# Patient Record
Sex: Male | Born: 1960
Health system: Southern US, Community
[De-identification: ages and names within clinical notes are randomized; demographics above are authoritative.]

## PROBLEM LIST (undated history)

## (undated) DIAGNOSIS — J189 Pneumonia, unspecified organism: Secondary | ICD-10-CM

## (undated) DIAGNOSIS — K635 Polyp of colon: Secondary | ICD-10-CM

## (undated) DIAGNOSIS — R112 Nausea with vomiting, unspecified: Secondary | ICD-10-CM

## (undated) DIAGNOSIS — Z460 Encounter for fitting and adjustment of spectacles and contact lenses: Secondary | ICD-10-CM

## (undated) DIAGNOSIS — M1990 Unspecified osteoarthritis, unspecified site: Secondary | ICD-10-CM

## (undated) DIAGNOSIS — Z9889 Other specified postprocedural states: Secondary | ICD-10-CM

## (undated) HISTORY — DX: Unspecified osteoarthritis, unspecified site: M19.90

## (undated) HISTORY — PX: MELANOMA EXCISION: SHX5266

## (undated) HISTORY — DX: Polyp of colon: K63.5

---

## 1999-09-12 HISTORY — PX: BACK SURGERY: SHX140

## 1999-12-26 ENCOUNTER — Ambulatory Visit (HOSPITAL_COMMUNITY): Admission: RE | Admit: 1999-12-26 | Discharge: 1999-12-26 | Payer: Self-pay | Admitting: Gastroenterology

## 2002-07-02 ENCOUNTER — Encounter: Admission: RE | Admit: 2002-07-02 | Discharge: 2002-07-02 | Payer: Self-pay | Admitting: Neurosurgery

## 2002-07-02 ENCOUNTER — Encounter: Payer: Self-pay | Admitting: Neurosurgery

## 2002-07-15 ENCOUNTER — Encounter: Payer: Self-pay | Admitting: Neurosurgery

## 2002-07-15 ENCOUNTER — Encounter: Admission: RE | Admit: 2002-07-15 | Discharge: 2002-07-15 | Payer: Self-pay | Admitting: Neurosurgery

## 2002-10-01 ENCOUNTER — Encounter: Payer: Self-pay | Admitting: Orthopaedic Surgery

## 2002-10-01 ENCOUNTER — Encounter: Admission: RE | Admit: 2002-10-01 | Discharge: 2002-10-01 | Payer: Self-pay | Admitting: Orthopaedic Surgery

## 2002-12-12 ENCOUNTER — Encounter: Admission: RE | Admit: 2002-12-12 | Discharge: 2002-12-12 | Payer: Self-pay | Admitting: Orthopaedic Surgery

## 2002-12-12 ENCOUNTER — Encounter: Payer: Self-pay | Admitting: Orthopaedic Surgery

## 2003-03-04 ENCOUNTER — Encounter: Payer: Self-pay | Admitting: Orthopaedic Surgery

## 2003-03-05 ENCOUNTER — Encounter: Payer: Self-pay | Admitting: Orthopaedic Surgery

## 2003-03-05 ENCOUNTER — Ambulatory Visit (HOSPITAL_COMMUNITY): Admission: RE | Admit: 2003-03-05 | Discharge: 2003-03-06 | Payer: Self-pay | Admitting: Orthopaedic Surgery

## 2003-03-05 ENCOUNTER — Encounter (INDEPENDENT_AMBULATORY_CARE_PROVIDER_SITE_OTHER): Payer: Self-pay | Admitting: *Deleted

## 2007-09-12 DIAGNOSIS — K635 Polyp of colon: Secondary | ICD-10-CM

## 2007-09-12 HISTORY — PX: COLONOSCOPY W/ POLYPECTOMY: SHX1380

## 2007-09-12 HISTORY — DX: Polyp of colon: K63.5

## 2009-01-19 ENCOUNTER — Ambulatory Visit: Payer: Self-pay | Admitting: Sports Medicine

## 2009-01-19 ENCOUNTER — Encounter: Admission: RE | Admit: 2009-01-19 | Discharge: 2009-01-19 | Payer: Self-pay | Admitting: Sports Medicine

## 2009-01-19 DIAGNOSIS — M2020 Hallux rigidus, unspecified foot: Secondary | ICD-10-CM | POA: Insufficient documentation

## 2009-01-19 DIAGNOSIS — R109 Unspecified abdominal pain: Secondary | ICD-10-CM | POA: Insufficient documentation

## 2009-03-16 ENCOUNTER — Ambulatory Visit: Payer: Self-pay | Admitting: Sports Medicine

## 2009-03-16 DIAGNOSIS — M79609 Pain in unspecified limb: Secondary | ICD-10-CM | POA: Insufficient documentation

## 2011-01-27 NOTE — Op Note (Signed)
NAME:  Mark Atkins, Mark Atkins                            ACCOUNT NO.:  [id number]   MEDICAL RECORD NO.:  [id number]                   PATIENT TYPE:  OIB   LOCATION:  NA                                   FACILITY:  [facility]   PHYSICIAN:  Sharolyn Douglas, M.D.                     DATE OF BIRTH:  04/25/1961   DATE OF PROCEDURE:  03/05/2003  DATE OF DISCHARGE:                                 OPERATIVE REPORT   PREOPERATIVE DIAGNOSIS:  L4 herniated nucleus pulposus with left L5  radiculopathy.   POSTOPERATIVE DIAGNOSIS:  L4 herniated nucleus pulposus with left L5  radiculopathy.   PROCEDURE:  1. Percutaneous lumbar discectomy L4-L5.  2. Radiographic interpretation of fluoroscopic images used for needle     localization.   INDICATIONS FOR PROCEDURE:  The patient is a 50 year old male with a greater  than one year history of back and left lower extremity radicular pain.  He  has seen several physicians.  He had an MRI scan which showed degenerative  disc disease with a focal HNP at L4-L5 with left L5 nerve root compression.  He has tried three epidural steroid injections and physical therapy.  We  have had several lengthy discussions regarding various treatment options.  He has elected to undergo percutaneous discectomies to have some improvement  in his symptoms.  The risks and benefits were thoroughly explained.   PROCEDURE:  The patient was properly identified in the holding area, was  taken to the operating room.  He was carefully positioned prone on the  [place] table with a pillow under his abdomen to flex his lumbar spine.  He  was sedated by the anesthesia service.  He was given prophylactic IV  antibiotics.  The back was prepped and draped in the usual sterile fashion.  Fluoroscopy was brought into the field and we obtained a 45 degree oblique  view of the L4-L5 level.  We then used a spinal needle to inject 1 mL of  lidocaine with epinephrine along the planned needle trajectory.  The  introducer needle was then placed into the L4-L5 disc space using a  posterolateral approach.  The patient was questioned if he had developed any  leg pain.  At no point did he have any lower extremity pain.  The needle was  then confirmed to be in good position, AP and lateral views.  We then  injected 1 mL of Ancef solution into the disc space to irrigate.  The  patient did feel back pain on injection of the antibiotic solution.  We then  placed the Stryker decompressor probe in through the introducer needle and  made multiple passes over three minutes under fluoroscopic visualization.  We removed 2.5 mL of disc material which was sent to the pathology lab.  The  Stryker decompressor probe was then removed.  1 mL of Ancef solution was  then injected through the introducer needle.  The introducer needle was  removed and a Band-Aid was placed over the patient's puncture site.  He was  turned supine and transferred to the recovery room in stable condition, able  to move his upper and lower extremities.                                               Sharolyn Douglas, M.D.    MC/MEDQ  D:  03/05/2003  T:  03/06/2003  Job:  [id number]

## 2011-05-04 ENCOUNTER — Ambulatory Visit (INDEPENDENT_AMBULATORY_CARE_PROVIDER_SITE_OTHER): Payer: BC Managed Care – PPO | Admitting: General Surgery

## 2011-05-04 ENCOUNTER — Encounter (INDEPENDENT_AMBULATORY_CARE_PROVIDER_SITE_OTHER): Payer: Self-pay | Admitting: General Surgery

## 2011-05-04 DIAGNOSIS — K4090 Unilateral inguinal hernia, without obstruction or gangrene, not specified as recurrent: Secondary | ICD-10-CM | POA: Insufficient documentation

## 2011-05-04 NOTE — Patient Instructions (Signed)
Plan for right inguinal hernia repair with mesh 

## 2011-05-04 NOTE — Progress Notes (Signed)
Subjective:     Patient ID: Mark Atkins, male   DOB: 02/08/1961, 50 y.o.   MRN: [id number]  HPI The patient is a 50 year old white male who is been having some dull right-sided groin pain for the last 5 weeks. He is very active with biking and swimming. He does not remember any particular event that started the pain. Shortly after noticing the pain he started to see a bulge in the right groin. He denies any nausea or vomiting. No fevers or chills. No chest pain or shortness of breath. His bowels move regularly.  Review of Systems  Constitutional: Negative.   HENT: Negative.   Eyes: Negative.   Respiratory: Negative.   Cardiovascular: Negative.   Gastrointestinal: Negative.   Genitourinary: Negative.   Musculoskeletal: Negative.   Skin: Negative.   Neurological: Negative.   Hematological: Negative.   Psychiatric/Behavioral: Negative.    Past Medical History  Diagnosis Date  . Asthma   . Colon polyp 2009  . Arthritis     left knee   Past Surgical History  Procedure Date  . Colonoscopy w/ polypectomy 2009  . Melanoma excision     2009   Current outpatient prescriptions:Albuterol Sulfate (PROVENTIL HFA IN), Inhale into the lungs as needed.  , Disp: , Rfl: ;  aspirin 81 MG tablet, Take 81 mg by mouth daily.  , Disp: , Rfl: ;  clonazePAM (KLONOPIN) 0.5 MG tablet, daily., Disp: , Rfl: ;  Multiple Vitamin (MULTIVITAMIN) capsule, Take 1 capsule by mouth daily.  , Disp: , Rfl:   No Known Allergies      Objective:   Physical Exam  Constitutional: He is oriented to person, place, and time. He appears well-developed and well-nourished.  HENT:  Head: Normocephalic and atraumatic.  Eyes: Conjunctivae and EOM are normal. Pupils are equal, round, and reactive to light.  Neck: Normal range of motion. Neck supple.  Cardiovascular: Normal rate, regular rhythm and normal heart sounds.   Pulmonary/Chest: Effort normal and breath sounds normal.  Abdominal: Soft. Bowel sounds are normal.    Genitourinary: Penis normal.       The patient has a small bulge in the right groin that is easily reducible. No bulge or impulse with straining in the left groin.  Musculoskeletal: Normal range of motion.  Neurological: He is alert and oriented to person, place, and time.  Skin: Skin is warm and dry.  Psychiatric: He has a normal mood and affect. His behavior is normal.       Assessment:     Small but symptomatic right inguinal hernia.    Plan:     Because of the risk of incarceration and strangulation I think he would benefit from having this fixed. He would also like to have this done. I've discussed with him in detail the risks and benefits of the operation to fix the hernia as well as some technical aspects and he understands and wishes to proceed.

## 2011-06-07 ENCOUNTER — Other Ambulatory Visit (INDEPENDENT_AMBULATORY_CARE_PROVIDER_SITE_OTHER): Payer: Self-pay | Admitting: General Surgery

## 2011-06-07 ENCOUNTER — Encounter (HOSPITAL_COMMUNITY): Payer: BC Managed Care – PPO

## 2011-06-07 LAB — BASIC METABOLIC PANEL
BUN: 20 mg/dL (ref 6–23)
CO2: 30 mEq/L (ref 19–32)
Calcium: 9.4 mg/dL (ref 8.4–10.5)
Chloride: 104 mEq/L (ref 96–112)
Creatinine, Ser: 1.06 mg/dL (ref 0.50–1.35)
Glucose, Bld: 121 mg/dL — ABNORMAL HIGH (ref 70–99)

## 2011-06-07 LAB — CBC
HCT: 42.4 % (ref 39.0–52.0)
Hemoglobin: 14.5 g/dL (ref 13.0–17.0)
MCH: 31.8 pg (ref 26.0–34.0)
MCV: 93 fL (ref 78.0–100.0)
Platelets: 279 10*3/uL (ref 150–400)
RBC: 4.56 MIL/uL (ref 4.22–5.81)
WBC: 5.5 10*3/uL (ref 4.0–10.5)

## 2011-06-07 LAB — DIFFERENTIAL
Eosinophils Absolute: 0.1 10*3/uL (ref 0.0–0.7)
Lymphocytes Relative: 29 % (ref 12–46)
Lymphs Abs: 1.6 10*3/uL (ref 0.7–4.0)
Monocytes Relative: 10 % (ref 3–12)
Neutrophils Relative %: 60 % (ref 43–77)

## 2011-06-07 LAB — SURGICAL PCR SCREEN: MRSA, PCR: NEGATIVE

## 2011-06-12 HISTORY — PX: HERNIA REPAIR: SHX51

## 2011-06-14 ENCOUNTER — Observation Stay (HOSPITAL_COMMUNITY)
Admission: RE | Admit: 2011-06-14 | Discharge: 2011-06-15 | Disposition: A | Discharge status: Home / Self Care | Payer: BC Managed Care – PPO | Source: Ambulatory Visit | Attending: General Surgery | Admitting: General Surgery

## 2011-06-14 DIAGNOSIS — Z01812 Encounter for preprocedural laboratory examination: Secondary | ICD-10-CM | POA: Insufficient documentation

## 2011-06-14 DIAGNOSIS — K4090 Unilateral inguinal hernia, without obstruction or gangrene, not specified as recurrent: Principal | ICD-10-CM | POA: Insufficient documentation

## 2011-06-30 NOTE — Op Note (Signed)
NAME:  Mark Atkins, Mark Atkins NO.:  [id number]  MEDICAL RECORD NO.:  [id number]  LOCATION:  DAYL                         FACILITY:  [facility]  PHYSICIAN:  Ollen Gross. Vernell Morgans, M.D. DATE OF BIRTH:  09/05/1961  DATE OF PROCEDURE:  06/14/2011 DATE OF DISCHARGE:                              OPERATIVE REPORT   PREOPERATIVE DIAGNOSIS:  Right inguinal hernia.  POSTOPERATIVE DIAGNOSIS:  Right indirect inguinal hernia.  PROCEDURE:  Right inguinal hernia repair with mesh.  SURGEON:  Ollen Gross. Vernell Morgans, M.D.  ANESTHESIA:  General endotracheal.  PROCEDURE IN DETAIL:  After informed consent was obtained, the patient was brought to the operating room, placed in the supine position on the operating table.  After adequate induction of general anesthesia, the patient's abdomen and right groin were prepped with ChloraPrep, allowed to dry and draped in usual sterile manner.  The right groin area was then infiltrated with 0.25% Marcaine.  A small incision was made from the edge of the pubic tubercle on the right towards the anterior cephalic spine.  This incision was carried down through the skin and subcutaneous tissue sharply with the electrocautery until the fascia of the external oblique was encountered.  A small bridging vein was clamped with hemostats, divided and ligated with 3-0 silk ties.  The external oblique fascia was opened along its fibers towards the apex of the external ring with a 15 blade knife and Metzenbaum scissors.  Weitlaner retractor was deployed.  Blunt dissection was carried out of the cord structures until they could be surrounded between two fingers.  1/2-inch Penrose drain was placed around the cord structures for retraction purposes.  The ilioinguinal nerve was identified.  It was involved and some scar was dissected free, proximally and distally clamped with hemostats, divided and ligated with 3-0 silk ties.  The cord structure was then gently skeletonized by  blunt hemostat dissection until a hernia sac was able to be identified.  It was gently separated from the rest of the cord structures.  The hernia sac was thick and very small.  It was therefore reduced back beneath the transversalis muscle without difficulty.  Next, a 3 x 6 piece of Ultrapro mesh was chosen and cut to fit.  The patient also had a moderate sized lipoma of the cord which was also separated from the rest of the cord structures gently and then clamped at its base, divided and ligated with a 3-0 silk tie.  The mesh tails were cut with the mesh laterally.  The mesh was sewed inferiorly to the shelving edge of the inguinal ligament with a running 2-0 Prolene stitch.  The tails were wrapped around the cord superiorly.  The mesh was sewed to the muscular aponeurotic strength of the transversalis with interrupted 2-0 Prolene vertical mattress stitches.  Lateral to the cord, the mesh was anchored to the shelving edge of inguinal ligament with interrupted 2-0 Prolene stitch.  Once this was accomplished, the hernia appeared to be well repaired.  Mesh was in good position without any tension.  The wound was irrigated with copious amounts of saline. The external oblique fascia was then reapproximated with a running 2-0  Vicryl stitch.  The wound was infiltrated more with Exparel.  The subcutaneous fascia was closed with a running 3-0 Vicryl stitch and the skin was closed with a running 4-0 Monocryl subcuticular stitch. Dermabond dressing was then applied.  The patient tolerated the procedure well.  At the end of the case, all needle, sponge, and instrument counts were correct.  The patient's testicle was in the scrotum at the end of the case.     Ollen Gross. Vernell Morgans, M.D.     PST/MEDQ  D:  06/14/2011  T:  06/14/2011  Job:  [id number]  Electronically Signed by Chevis Pretty III M.D. on 06/30/2011 09:06:42 AM

## 2011-07-10 ENCOUNTER — Encounter (INDEPENDENT_AMBULATORY_CARE_PROVIDER_SITE_OTHER): Payer: Self-pay | Admitting: General Surgery

## 2011-07-10 ENCOUNTER — Ambulatory Visit (INDEPENDENT_AMBULATORY_CARE_PROVIDER_SITE_OTHER): Payer: BC Managed Care – PPO | Admitting: General Surgery

## 2011-07-10 DIAGNOSIS — K4090 Unilateral inguinal hernia, without obstruction or gangrene, not specified as recurrent: Secondary | ICD-10-CM

## 2011-07-10 NOTE — Discharge Summary (Signed)
  NAMESANFORD, LINDBLAD NO.:  [id number]  MEDICAL RECORD NO.:  [id number]  LOCATION:  1537                         FACILITY:  [facility]  PHYSICIAN:  Ollen Gross. Vernell Morgans, M.D. DATE OF BIRTH:  06-22-1961  DATE OF ADMISSION:  06/14/2011 DATE OF DISCHARGE:  06/15/2011                              DISCHARGE SUMMARY   Mr. Eline was brought to the operating room on June 14, 2011, and underwent a right inguinal repair with mesh.  She tolerated this well. Postoperatively, he was discharged home.  MEDICATIONS:  He was to resume his home meds.  He was given a prescription for pain medicine.  DIET:  As tolerated.  ACTIVITIES:  No heavy lifting.  FOLLOWUP:  He will follow up with Dr. Carolynne Edouard in 2 weeks and he is discharged home.     Ollen Gross. Vernell Morgans, M.D.     PST/MEDQ  D:  06/30/2011  T:  06/30/2011  Job:  [id number]  Electronically Signed by Chevis Pretty III M.D. on 07/10/2011 08:10:13 AM

## 2011-07-10 NOTE — Patient Instructions (Signed)
No heavy lifting for another 3 weeks 

## 2011-07-11 NOTE — Progress Notes (Signed)
Subjective:     Patient ID: Mark Atkins, male   DOB: 01/22/1961, 50 y.o.   MRN: [id number]  HPI The patient is a 50 year old white male who is now about 3 weeks out from a right inguinal hernia repair with mesh. He seems to be doing well. His soreness is improving. He seems to be moving around comfortably. His appetite is good and his bowels are working normally.  Review of Systems     Objective:   Physical Exam On exam his abdomen is soft and nontender. His right groin incision is healing nicely. There is no sign of infection. There is no palpable evidence for recurrence of the hernia.    Assessment:     3 weeks status post right inguinal hernia repair with mesh    Plan:     At this point I would like him to refrain from any strenuous activity or heavy lifting for about another 3 weeks. We will plan to see him back at the end of this time to clear him for full activity.

## 2011-11-21 ENCOUNTER — Other Ambulatory Visit: Payer: Self-pay | Admitting: Dermatology

## 2011-12-18 ENCOUNTER — Other Ambulatory Visit: Payer: Self-pay | Admitting: Plastic Surgery

## 2012-08-14 ENCOUNTER — Other Ambulatory Visit: Payer: Self-pay | Admitting: Dermatology

## 2013-01-15 ENCOUNTER — Other Ambulatory Visit: Payer: Self-pay | Admitting: Dermatology

## 2013-08-11 HISTORY — PX: GREAT TOE ARTHRODESIS, INTERPHALANGEAL JOINT: SUR55

## 2014-04-10 ENCOUNTER — Other Ambulatory Visit: Payer: Self-pay | Admitting: Orthopedic Surgery

## 2014-04-16 ENCOUNTER — Encounter (HOSPITAL_BASED_OUTPATIENT_CLINIC_OR_DEPARTMENT_OTHER): Payer: Self-pay | Admitting: *Deleted

## 2014-04-16 NOTE — Progress Notes (Signed)
No labs needed-did have physical 6/15 pcp-dr rankin

## 2014-04-21 NOTE — H&P (Signed)
Mark Atkins is an 53 y.o. male.    Chief Complaint: Right Knee Pain  HPI: Mr. Mark Atkins returns  with continued occasional catching and giving away of his right knee.  MRI scan is consistent with a complex tear posterior horn of the medial meniscus with some small para meniscal cyst.  Reviewing his MRI scan looks like he has a combination horizontal cleavage tear with a displaced parrot-beak tear.  There is also grade 3 to grade 4 chondromalacia of the apex of the patella.  Grade 2 chondromalacia to the medial femoral compartment.  Past Medical History  Diagnosis Date  . Asthma   . Colon polyp 2009  . Arthritis     left knee  . PONV (postoperative nausea and vomiting)   . Contact lens/glasses fitting     wears contacts or glasses    Past Surgical History  Procedure Laterality Date  . Colonoscopy w/ polypectomy  2009  . Melanoma excision      2009  . Hernia repair  10/12    inguinal right  . Back surgery  2001    disk  . Great toe arthrodesis, interphalangeal joint  12/14    rt    Family History  Problem Relation Age of Onset  . Hyperlipidemia Mother   . Cancer Father     colon, skin, stomach   Social History:  reports that he has never smoked. He has never used smokeless tobacco. He reports that he drinks about 4.2 ounces of alcohol per week. He reports that he does not use illicit drugs.  Allergies: No Known Allergies  No prescriptions prior to admission    No results found for this or any previous visit (from the past 48 hour(s)). No results found.  Review of Systems  Constitutional: Negative.   HENT: Negative.   Eyes: Negative.   Respiratory: Negative.   Cardiovascular: Negative.   Gastrointestinal: Negative.   Genitourinary: Negative.   Musculoskeletal: Positive for joint pain.  Skin: Negative.   Neurological: Negative.   Endo/Heme/Allergies: Negative.   Psychiatric/Behavioral: The patient is nervous/anxious.     Height 5\' 10"  (1.778 m), weight 79.379 kg  (175 lb). Physical Exam  Constitutional: He is oriented to person, place, and time. He appears well-developed and well-nourished.  HENT:  Head: Normocephalic and atraumatic.  Eyes: Pupils are equal, round, and reactive to light.  Neck: Normal range of motion. Neck supple.  Cardiovascular: Intact distal pulses.   Respiratory: Effort normal.  Musculoskeletal: He exhibits tenderness.  Tender along the medial joint line of the right knee, no palpable effusion.  McMurray's test reproduces pain.    Neurological: He is alert and oriented to person, place, and time.  Skin: Skin is warm and dry.  Psychiatric: He has a normal mood and affect. His behavior is normal. Judgment and thought content normal.     Assessment/Plan Assess: Right knee combination horizontal cleavage and displaced parrot-beak tear posterior horn medial meniscus  Plan: The patient would like to proceed with elective arthroscopic evaluation in September hopefully, the cortisone shot will see him through this period of time.  He will avoid any running or jumping activities.  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  PHILLIPS, ERIC R 04/21/2014, 9:58 AM

## 2014-04-22 ENCOUNTER — Encounter (HOSPITAL_BASED_OUTPATIENT_CLINIC_OR_DEPARTMENT_OTHER)
Admission: RE | Disposition: A | Discharge status: Home / Self Care | Payer: Self-pay | Source: Ambulatory Visit | Attending: Orthopedic Surgery

## 2014-04-22 ENCOUNTER — Ambulatory Visit (HOSPITAL_BASED_OUTPATIENT_CLINIC_OR_DEPARTMENT_OTHER): Payer: BC Managed Care – PPO | Admitting: Anesthesiology

## 2014-04-22 ENCOUNTER — Ambulatory Visit (HOSPITAL_BASED_OUTPATIENT_CLINIC_OR_DEPARTMENT_OTHER)
Admission: RE | Admit: 2014-04-22 | Discharge: 2014-04-22 | Disposition: A | Discharge status: Home / Self Care | Payer: BC Managed Care – PPO | Source: Ambulatory Visit | Attending: Orthopedic Surgery | Admitting: Orthopedic Surgery

## 2014-04-22 ENCOUNTER — Encounter (HOSPITAL_BASED_OUTPATIENT_CLINIC_OR_DEPARTMENT_OTHER): Payer: Self-pay | Admitting: Anesthesiology

## 2014-04-22 ENCOUNTER — Encounter (HOSPITAL_BASED_OUTPATIENT_CLINIC_OR_DEPARTMENT_OTHER): Payer: BC Managed Care – PPO | Admitting: Anesthesiology

## 2014-04-22 DIAGNOSIS — J45909 Unspecified asthma, uncomplicated: Secondary | ICD-10-CM | POA: Diagnosis not present

## 2014-04-22 DIAGNOSIS — S83241A Other tear of medial meniscus, current injury, right knee, initial encounter: Secondary | ICD-10-CM

## 2014-04-22 DIAGNOSIS — M2240 Chondromalacia patellae, unspecified knee: Secondary | ICD-10-CM | POA: Insufficient documentation

## 2014-04-22 DIAGNOSIS — M23329 Other meniscus derangements, posterior horn of medial meniscus, unspecified knee: Secondary | ICD-10-CM | POA: Diagnosis present

## 2014-04-22 HISTORY — DX: Encounter for fitting and adjustment of spectacles and contact lenses: Z46.0

## 2014-04-22 HISTORY — DX: Other specified postprocedural states: Z98.890

## 2014-04-22 HISTORY — PX: KNEE ARTHROSCOPY WITH MENISCAL REPAIR: SHX5653

## 2014-04-22 HISTORY — DX: Nausea with vomiting, unspecified: R11.2

## 2014-04-22 SURGERY — ARTHROSCOPY, KNEE, WITH MENISCUS REPAIR
Anesthesia: Regional | Site: Knee | Laterality: Right

## 2014-04-22 MED ORDER — MIDAZOLAM HCL 2 MG/2ML IJ SOLN
INTRAMUSCULAR | Status: AC
  Filled 2014-04-22: qty 2

## 2014-04-22 MED ORDER — CEFAZOLIN SODIUM-DEXTROSE 2-3 GM-% IV SOLR: 2.0000 g | INTRAVENOUS | Status: DC

## 2014-04-22 MED ORDER — POVIDONE-IODINE 7.5 % EX SOLN: Freq: Once | CUTANEOUS | Status: DC

## 2014-04-22 MED ORDER — ONDANSETRON HCL 4 MG PO TABS: 4.0000 mg | ORAL_TABLET | Freq: Four times a day (QID) | ORAL | Status: DC | PRN

## 2014-04-22 MED ORDER — LACTATED RINGERS IV SOLN
INTRAVENOUS | Status: DC
  Administered 2014-04-22 (×2): via INTRAVENOUS

## 2014-04-22 MED ORDER — MIDAZOLAM HCL 2 MG/2ML IJ SOLN
1.0000 mg | INTRAMUSCULAR | Status: DC | PRN
  Administered 2014-04-22: 2 mg via INTRAVENOUS

## 2014-04-22 MED ORDER — OXYCODONE HCL 5 MG/5ML PO SOLN: 5.0000 mg | Freq: Once | ORAL | Status: DC | PRN

## 2014-04-22 MED ORDER — FENTANYL CITRATE 0.05 MG/ML IJ SOLN
INTRAMUSCULAR | Status: AC
  Filled 2014-04-22: qty 2

## 2014-04-22 MED ORDER — FENTANYL CITRATE 0.05 MG/ML IJ SOLN
INTRAMUSCULAR | Status: AC
  Filled 2014-04-22: qty 6

## 2014-04-22 MED ORDER — ONDANSETRON HCL 4 MG/2ML IJ SOLN: 4.0000 mg | Freq: Four times a day (QID) | INTRAMUSCULAR | Status: DC | PRN

## 2014-04-22 MED ORDER — SCOPOLAMINE 1 MG/3DAYS TD PT72
1.0000 | MEDICATED_PATCH | TRANSDERMAL | Status: DC
  Administered 2014-04-22: 1.5 mg via TRANSDERMAL

## 2014-04-22 MED ORDER — FENTANYL CITRATE 0.05 MG/ML IJ SOLN
INTRAMUSCULAR | Status: DC | PRN
  Administered 2014-04-22 (×2): 50 ug via INTRAVENOUS

## 2014-04-22 MED ORDER — METOCLOPRAMIDE HCL 5 MG/ML IJ SOLN: 5.0000 mg | Freq: Three times a day (TID) | INTRAMUSCULAR | Status: DC | PRN

## 2014-04-22 MED ORDER — ACETAMINOPHEN 325 MG PO TABS: 325.0000 mg | ORAL_TABLET | ORAL | Status: DC | PRN

## 2014-04-22 MED ORDER — HYDROCODONE-ACETAMINOPHEN 5-325 MG PO TABS: 1.0000 | ORAL_TABLET | Freq: Four times a day (QID) | ORAL | Status: DC | PRN

## 2014-04-22 MED ORDER — PROPOFOL 10 MG/ML IV BOLUS
INTRAVENOUS | Status: DC | PRN
  Administered 2014-04-22: 100 mg via INTRAVENOUS

## 2014-04-22 MED ORDER — ONDANSETRON HCL 4 MG/2ML IJ SOLN
INTRAMUSCULAR | Status: DC | PRN
  Administered 2014-04-22: 4 mg via INTRAVENOUS

## 2014-04-22 MED ORDER — MIDAZOLAM HCL 5 MG/5ML IJ SOLN
INTRAMUSCULAR | Status: DC | PRN
  Administered 2014-04-22: 2 mg via INTRAVENOUS

## 2014-04-22 MED ORDER — SCOPOLAMINE 1 MG/3DAYS TD PT72
MEDICATED_PATCH | TRANSDERMAL | Status: AC
  Filled 2014-04-22: qty 1

## 2014-04-22 MED ORDER — DEXAMETHASONE SODIUM PHOSPHATE 10 MG/ML IJ SOLN
INTRAMUSCULAR | Status: DC | PRN
  Administered 2014-04-22: 10 mg via INTRAVENOUS

## 2014-04-22 MED ORDER — FENTANYL CITRATE 0.05 MG/ML IJ SOLN
50.0000 ug | INTRAMUSCULAR | Status: DC | PRN
  Administered 2014-04-22: 50 ug via INTRAVENOUS

## 2014-04-22 MED ORDER — LIDOCAINE HCL (CARDIAC) 20 MG/ML IV SOLN
INTRAVENOUS | Status: DC | PRN
  Administered 2014-04-22: 50 mg via INTRAVENOUS

## 2014-04-22 MED ORDER — KETOROLAC TROMETHAMINE 30 MG/ML IJ SOLN: 15.0000 mg | Freq: Once | INTRAMUSCULAR | Status: DC | PRN

## 2014-04-22 MED ORDER — OXYCODONE HCL 5 MG PO TABS: 5.0000 mg | ORAL_TABLET | Freq: Once | ORAL | Status: DC | PRN

## 2014-04-22 MED ORDER — PROPOFOL INFUSION 10 MG/ML OPTIME
INTRAVENOUS | Status: DC | PRN
  Administered 2014-04-22: 100 ug/kg/min via INTRAVENOUS

## 2014-04-22 MED ORDER — ROPIVACAINE HCL 5 MG/ML IJ SOLN
INTRAMUSCULAR | Status: DC | PRN
  Administered 2014-04-22: 20 mL via PERINEURAL

## 2014-04-22 MED ORDER — CEFAZOLIN SODIUM-DEXTROSE 2-3 GM-% IV SOLR
INTRAVENOUS | Status: DC | PRN
  Administered 2014-04-22: 2 g via INTRAVENOUS

## 2014-04-22 MED ORDER — ACETAMINOPHEN 160 MG/5ML PO SOLN: 325.0000 mg | ORAL | Status: DC | PRN

## 2014-04-22 MED ORDER — HYDROMORPHONE HCL PF 1 MG/ML IJ SOLN: 0.2500 mg | INTRAMUSCULAR | Status: DC | PRN

## 2014-04-22 MED ORDER — METOCLOPRAMIDE HCL 5 MG PO TABS: 5.0000 mg | ORAL_TABLET | Freq: Three times a day (TID) | ORAL | Status: DC | PRN

## 2014-04-22 SURGICAL SUPPLY — 47 items
BANDAGE ELASTIC 6 VELCRO ST LF (GAUZE/BANDAGES/DRESSINGS) ×2 IMPLANT
BLADE 4.2CUDA (BLADE) IMPLANT
BLADE CUTTER GATOR 3.5 (BLADE) ×2 IMPLANT
BLADE GREAT WHITE 4.2 (BLADE) IMPLANT
BNDG COHESIVE 6X5 TAN STRL LF (GAUZE/BANDAGES/DRESSINGS) ×2 IMPLANT
CANISTER SUCT 3000ML (MISCELLANEOUS) IMPLANT
DRAPE ARTHROSCOPY W/POUCH 114 (DRAPES) ×2 IMPLANT
DURAPREP 26ML APPLICATOR (WOUND CARE) ×2 IMPLANT
ELECT MENISCUS 165MM 90D (ELECTRODE) IMPLANT
ELECT REM PT RETURN 9FT ADLT (ELECTROSURGICAL)
ELECTRODE REM PT RTRN 9FT ADLT (ELECTROSURGICAL) IMPLANT
GAUZE SPONGE 4X4 12PLY STRL (GAUZE/BANDAGES/DRESSINGS) ×2 IMPLANT
GAUZE XEROFORM 1X8 LF (GAUZE/BANDAGES/DRESSINGS) ×2 IMPLANT
GLOVE BIO SURGEON STRL SZ 6.5 (GLOVE) ×2 IMPLANT
GLOVE BIO SURGEON STRL SZ7.5 (GLOVE) ×2 IMPLANT
GLOVE BIO SURGEON STRL SZ8.5 (GLOVE) ×2 IMPLANT
GLOVE BIOGEL PI IND STRL 7.0 (GLOVE) ×2 IMPLANT
GLOVE BIOGEL PI IND STRL 8 (GLOVE) ×1 IMPLANT
GLOVE BIOGEL PI IND STRL 9 (GLOVE) ×1 IMPLANT
GLOVE BIOGEL PI INDICATOR 7.0 (GLOVE) ×2
GLOVE BIOGEL PI INDICATOR 8 (GLOVE) ×1
GLOVE BIOGEL PI INDICATOR 9 (GLOVE) ×1
GLOVE ECLIPSE 7.0 STRL STRAW (GLOVE) IMPLANT
GOWN STRL REUS W/ TWL LRG LVL3 (GOWN DISPOSABLE) ×1 IMPLANT
GOWN STRL REUS W/ TWL XL LVL3 (GOWN DISPOSABLE) ×2 IMPLANT
GOWN STRL REUS W/TWL LRG LVL3 (GOWN DISPOSABLE) ×1
GOWN STRL REUS W/TWL XL LVL3 (GOWN DISPOSABLE) ×2
IMMOBILIZER KNEE 24 THIGH 36 (MISCELLANEOUS) ×1 IMPLANT
IMMOBILIZER KNEE 24 UNIV (MISCELLANEOUS) ×2
IV NS IRRIG 3000ML ARTHROMATIC (IV SOLUTION) ×4 IMPLANT
KNEE WRAP E Z 3 GEL PACK (MISCELLANEOUS) ×2 IMPLANT
MANIFOLD NEPTUNE II (INSTRUMENTS) ×2 IMPLANT
NDL SAFETY ECLIPSE 18X1.5 (NEEDLE) ×1 IMPLANT
NEEDLE HYPO 18GX1.5 SHARP (NEEDLE) ×1
NEEDLE MENISCAL REPAIR W/EYELT (NEEDLE) IMPLANT
PACK ARTHROSCOPY DSU (CUSTOM PROCEDURE TRAY) ×2 IMPLANT
PACK BASIN DAY SURGERY FS (CUSTOM PROCEDURE TRAY) ×2 IMPLANT
PAD ALCOHOL SWAB (MISCELLANEOUS) ×2 IMPLANT
PENCIL BUTTON HOLSTER BLD 10FT (ELECTRODE) IMPLANT
SET ARTHROSCOPY TUBING (MISCELLANEOUS) ×1
SET ARTHROSCOPY TUBING LN (MISCELLANEOUS) ×1 IMPLANT
SLEEVE SCD COMPRESS KNEE MED (MISCELLANEOUS) IMPLANT
SYR 3ML 18GX1 1/2 (SYRINGE) IMPLANT
SYR 5ML LL (SYRINGE) ×2 IMPLANT
TOWEL OR 17X24 6PK STRL BLUE (TOWEL DISPOSABLE) ×2 IMPLANT
WAND STAR VAC 90 (SURGICAL WAND) IMPLANT
WATER STERILE IRR 1000ML POUR (IV SOLUTION) ×2 IMPLANT

## 2014-04-22 NOTE — Discharge Instructions (Addendum)
Arthroscopic Procedure, Knee An arthroscopic procedure can find what is wrong with your knee. PROCEDURE Arthroscopy is a surgical technique that allows your orthopedic surgeon to diagnose and treat your knee injury with accuracy. They will look into your knee through a small instrument. This is almost like a small (pencil sized) telescope. Because arthroscopy affects your knee less than open knee surgery, you can anticipate a more rapid recovery. Taking an active role by following your caregiver's instructions will help with rapid and complete recovery. Use crutches, rest, elevation, ice, and knee exercises as instructed. The length of recovery depends on various factors including type of injury, age, physical condition, medical conditions, and your rehabilitation. Your knee is the joint between the large bones (femur and tibia) in your leg. Cartilage covers these bone ends which are smooth and slippery and allow your knee to bend and move smoothly. Two menisci, thick, semi-lunar shaped pads of cartilage which form a rim inside the joint, help absorb shock and stabilize your knee. Ligaments bind the bones together and support your knee joint. Muscles move the joint, help support your knee, and take stress off the joint itself. Because of this all programs and physical therapy to rehabilitate an injured or repaired knee require rebuilding and strengthening your muscles. AFTER THE PROCEDURE  After the procedure, you will be moved to a recovery area until most of the effects of the medication have worn off. Your caregiver will discuss the test results with you.  Only take over-the-counter or prescription medicines for pain, discomfort, or fever as directed by your caregiver. SEEK MEDICAL CARE IF:   You have increased bleeding from your wounds.  You see redness, swelling, or have increasing pain in your wounds.  You have pus coming from your wound.  You have an oral temperature above 102 F (38.9  C).  You notice a bad smell coming from the wound or dressing.  You have severe pain with any motion of your knee. SEEK IMMEDIATE MEDICAL CARE IF:   You develop a rash.  You have difficulty breathing.  You have any allergic problems. Document Released: 08/25/2000 Document Revised: 11/20/2011 Document Reviewed: 03/18/2008 [facility] Patient Information 2015 [place]. This information is not intended to replace advice given to you by your health care provider. Make sure you discuss any questions you have with your health care provider.  Regional Anesthesia Blocks  1. Numbness or the inability to move the "blocked" extremity may last from 3-48 hours after placement. The length of time depends on the medication injected and your individual response to the medication. If the numbness is not going away after 48 hours, call your surgeon.  2. The extremity that is blocked will need to be protected until the numbness is gone and the  Strength has returned. Because you cannot feel it, you will need to take extra care to avoid injury. Because it may be weak, you may have difficulty moving it or using it. You may not know what position it is in without looking at it while the block is in effect.  3. For blocks in the legs and feet, returning to weight bearing and walking needs to be done carefully. You will need to wait until the numbness is entirely gone and the strength has returned. You should be able to move your leg and foot normally before you try and bear weight or walk. You will need someone to be with you when you first try to ensure you do not fall and  possibly risk injury.  4. Bruising and tenderness at the needle site are common side effects and will resolve in a few days.  5. Persistent numbness or new problems with movement should be communicated to the surgeon or the [facility] [phone])/ [facility] [place] [phone]).  Post Anesthesia Home Care  Instructions  Activity: Get plenty of rest for the remainder of the day. A responsible adult should stay with you for 24 hours following the procedure.  For the next 24 hours, DO NOT: -Drive a car -Advertising copywriterperate machinery -Drink alcoholic beverages -Take any medication unless instructed by your physician -Make any legal decisions or sign important papers.  Meals: Start with liquid foods such as gelatin or soup. Progress to regular foods as tolerated. Avoid greasy, spicy, heavy foods. If nausea and/or vomiting occur, drink only clear liquids until the nausea and/or vomiting subsides. Call your physician if vomiting continues.  Special Instructions/Symptoms: Your throat may feel dry or sore from the anesthesia or the breathing tube placed in your throat during surgery. If this causes discomfort, gargle with warm salt water. The discomfort should disappear within 24 hours.

## 2014-04-22 NOTE — Anesthesia Procedure Notes (Addendum)
Anesthesia Regional Block:  Femoral nerve block  Pre-Anesthetic Checklist: ,, timeout performed, Correct Patient, Correct Site, Correct Laterality, Correct Procedure, Correct Position, site marked, Risks and benefits discussed,  Surgical consent,  Pre-op evaluation,  At surgeon's request and post-op pain management  Laterality: Lower  Prep: chloraprep       Needles:  Injection technique: Single-shot  Needle Type: Echogenic Needle          Additional Needles:  Procedures: ultrasound guided (picture in chart) and nerve stimulator Femoral nerve block  Nerve Stimulator or Paresthesia:  Response: quad, 0.5 mA,   Additional Responses:   Narrative:  Start time: 04/22/2014 11:18 AM End time: 04/22/2014 11:26 AM Injection made incrementally with aspirations every 5 mL.  Performed by: Personally  Anesthesiologist: Jammi Morrissette  Additional Notes: H+P and labs reviewed, risks and benefits discussed with patient, procedure tolerated well without complications   Procedure Name: LMA Insertion Date/Time: 04/22/2014 11:55 AM Performed by: Genevieve NorlanderLINKA, MARGARET L Pre-anesthesia Checklist: Patient identified, Emergency Drugs available, Suction available, Patient being monitored and Timeout performed Patient Re-evaluated:Patient Re-evaluated prior to inductionOxygen Delivery Method: Circle System Utilized Preoxygenation: Pre-oxygenation with 100% oxygen Intubation Type: IV induction Ventilation: Mask ventilation without difficulty LMA: LMA inserted LMA Size: 4.0 Number of attempts: 1 Airway Equipment and Method: bite block Placement Confirmation: positive ETCO2 and breath sounds checked- equal and bilateral Tube secured with: Tape Dental Injury: Teeth and Oropharynx as per pre-operative assessment

## 2014-04-22 NOTE — Progress Notes (Signed)
Assisted Dr. Maple HudsonMoser with right, ultrasound guided, femoral nerve block. Side rails up, monitors on throughout procedure. See vital signs in flow sheet. Tolerated Procedure well.

## 2014-04-22 NOTE — Anesthesia Preprocedure Evaluation (Signed)
Anesthesia Evaluation  Patient identified by MRN, date of birth, ID band Patient awake    Reviewed: Allergy & Precautions, H&P , NPO status , Patient's Chart, lab work & pertinent test results  History of Anesthesia Complications (+) history of anesthetic complications  Airway Mallampati: I TM Distance: >3 FB Neck ROM: Full    Dental  (+) Teeth Intact,    Pulmonary asthma , neg sleep apnea, neg COPD breath sounds clear to auscultation        Cardiovascular negative cardio ROS  Rhythm:Regular     Neuro/Psych negative neurological ROS  negative psych ROS   GI/Hepatic negative GI ROS,   Endo/Other    Renal/GU      Musculoskeletal  (+) Arthritis -, Right meniscal tear   Abdominal   Peds  Hematology   Anesthesia Other Findings   Reproductive/Obstetrics                           Anesthesia Physical Anesthesia Plan  ASA: II  Anesthesia Plan: General and Regional   Post-op Pain Management:    Induction: Intravenous  Airway Management Planned: LMA  Additional Equipment: None  Intra-op Plan:   Post-operative Plan: Extubation in OR  Informed Consent: I have reviewed the patients History and Physical, chart, labs and discussed the procedure including the risks, benefits and alternatives for the proposed anesthesia with the patient or authorized representative who has indicated his/her understanding and acceptance.   Dental advisory given  Plan Discussed with: CRNA and Surgeon  Anesthesia Plan Comments: (Patient with severe PONV which has required admission in past. Last procedure received TIVA, regional, 4mg  Zofran pre and intraop, Benadryl 12.5mg , Scopolamine patch, versed, and Decadron 10mg  without nausea post operatively)        Anesthesia Quick Evaluation

## 2014-04-22 NOTE — Interval H&P Note (Signed)
History and Physical Interval Note:  04/22/2014 11:34 AM  Mark PokePeter K Iafrate  has presented today for surgery, with the diagnosis of Right knee chondromalicia, medial meniscus tear  The various methods of treatment have been discussed with the patient and family. After consideration of risks, benefits and other options for treatment, the patient has consented to  Procedure(s) with comments: Right knee arthroscopy with medial meniscal repair (Right) - Right knee arthroscopy with medial meniscal repair as a surgical intervention .  The patient's history has been reviewed, patient examined, no change in status, stable for surgery.  I have reviewed the patient's chart and labs.  Questions were answered to the patient's satisfaction.     Nestor LewandowskyOWAN,Onika Gudiel J

## 2014-04-22 NOTE — Op Note (Addendum)
Pre-Op Dx: Right knee medial meniscal tear, chondromalacia medial femoral condyle  Postop Dx: Same   Procedure: Right knee arthroscopic partial medial meniscectomy posterior horn large.Debridement chondromalacia grade 3 the medial femoral condyle with removal of loose bodies from this region  Surgeon: Feliberto GottronFrank J. Turner Danielsowan M.D.  Assist: Tomi LikensEric K. Gaylene BrooksPhillips PA-C  (present throughout entire procedure and necessary for timely completion of the procedure) Anes: General LMA  EBL: Minimal  Fluids: 800 cc   Indications: Patient is catching popping and pain to the medial aspect of his right knee the pain is worse when he doesn't full extension consistent with a parrot-beak tear that is flipped into the joint appear. Pt has failed conservative treatment with anti-inflammatory medicines, physical therapy, and modified activites but did get good temporarily from an intra-articular cortisone injection. Pain has recurred and patient desires elective arthroscopic evaluation and treatment of knee. Risks and benefits of surgery have been discussed and questions answered.  Procedure: Patient identified by arm band and taken to the operating room at the day surgery Center. The appropriate anesthetic monitors were attached, and General LMA anesthesia was induced without difficulty. Lateral post was applied to the table and the lower extremity was prepped and draped in usual sterile fashion from the ankle to the midthigh. Time out procedure was performed. We began the operation by making standard inferior lateral and inferior medial peripatellar portals with a #11 blade allowing introduction of the arthroscope through the inferior lateral portal and the out flow to the inferior medial portal. Pump pressure was set at 100 mmHg and diagnostic arthroscopy  revealed the apex of the patella had a small area of grade 3 to grade 4 chondromalacia this is debrider back to a stable margin superiorly. The trochlea had grade 2 chondromalacia  lightly debrided medial femoral condyle had grade 3 chondromalacia with some loose flaps of cartilage likewise debrided distally and posteriorly. The posterior horn of the medial meniscus had a large parrot-beak tear that required debridement with straight biters and a 3.5 mm Gator sucker shaver. The anterior cruciate ligament and PCL are intact. The lateral compartment was in excellent condition. The gutters were cleared medially and laterally to. The knee was irrigated out normal saline solution. A dressing of xerofoam 4 x 4 dressing sponges, web roll and an Ace wrap was applied. The patient was awakened extubated and taken to the recovery without difficulty.    Signed: Nestor LewandowskyFrank J Kanoelani Dobies, MD

## 2014-04-22 NOTE — Anesthesia Postprocedure Evaluation (Signed)
  Anesthesia Post-op Note  Patient: Mark Atkins  Procedure(s) Performed: Procedure(s) with comments: Right knee arthroscopy with medial meniscal repair (Right) - Right knee arthroscopy with medial meniscal repair  Patient Location: PACU  Anesthesia Type:General and Regional  Level of Consciousness: awake, alert  and oriented  Airway and Oxygen Therapy: Patient Spontanous Breathing  Post-op Pain: none  Post-op Assessment: Post-op Vital signs reviewed, Patient's Cardiovascular Status Stable, Respiratory Function Stable, Patent Airway, No signs of Nausea or vomiting and Pain level controlled  Post-op Vital Signs: Reviewed and stable  Last Vitals:  Filed Vitals:   04/22/14 1436  BP: 121/71  Pulse: 58  Temp: 36.7 C  Resp: 18    Complications: No apparent anesthesia complications

## 2014-04-22 NOTE — Transfer of Care (Signed)
Immediate Anesthesia Transfer of Care Note  Patient: Mark Atkins  Procedure(s) Performed: Procedure(s) with comments: Right knee arthroscopy with medial meniscal repair (Right) - Right knee arthroscopy with medial meniscal repair  Patient Location: PACU  Anesthesia Type:General  Level of Consciousness: awake and patient cooperative  Airway & Oxygen Therapy: Patient Spontanous Breathing and Patient connected to face mask oxygen  Post-op Assessment: Report given to PACU RN and Post -op Vital signs reviewed and stable  Post vital signs: Reviewed and stable  Complications: No apparent anesthesia complications

## 2014-04-23 ENCOUNTER — Encounter (HOSPITAL_BASED_OUTPATIENT_CLINIC_OR_DEPARTMENT_OTHER): Payer: Self-pay | Admitting: Orthopedic Surgery

## 2014-04-23 NOTE — Addendum Note (Signed)
Addendum created 04/23/14 0640 by Lance CoonWesley Kevona Lupinacci, CRNA   Modules edited: Charges VN

## 2014-07-29 ENCOUNTER — Other Ambulatory Visit: Payer: Self-pay | Admitting: Dermatology

## 2014-08-13 ENCOUNTER — Other Ambulatory Visit: Payer: Self-pay | Admitting: Orthopedic Surgery

## 2014-08-13 DIAGNOSIS — M25561 Pain in right knee: Secondary | ICD-10-CM

## 2014-08-20 ENCOUNTER — Other Ambulatory Visit: Payer: BC Managed Care – PPO

## 2014-08-21 ENCOUNTER — Ambulatory Visit
Admission: RE | Admit: 2014-08-21 | Discharge: 2014-08-21 | Disposition: A | Discharge status: Home / Self Care | Payer: BC Managed Care – PPO | Source: Ambulatory Visit | Attending: Orthopedic Surgery | Admitting: Orthopedic Surgery

## 2014-08-21 DIAGNOSIS — M25561 Pain in right knee: Secondary | ICD-10-CM

## 2014-08-27 ENCOUNTER — Other Ambulatory Visit: Payer: BC Managed Care – PPO

## 2014-12-25 ENCOUNTER — Institutional Professional Consult (permissible substitution): Payer: Self-pay | Admitting: Internal Medicine

## 2015-08-17 ENCOUNTER — Other Ambulatory Visit: Payer: Self-pay | Admitting: General Surgery

## 2015-08-17 DIAGNOSIS — R1031 Right lower quadrant pain: Secondary | ICD-10-CM

## 2015-08-31 ENCOUNTER — Ambulatory Visit
Admission: RE | Admit: 2015-08-31 | Discharge: 2015-08-31 | Disposition: A | Discharge status: Home / Self Care | Payer: BLUE CROSS/BLUE SHIELD | Source: Ambulatory Visit | Attending: General Surgery | Admitting: General Surgery

## 2015-08-31 DIAGNOSIS — R1031 Right lower quadrant pain: Secondary | ICD-10-CM

## 2015-08-31 MED ORDER — IOPAMIDOL (ISOVUE-300) INJECTION 61%
100.0000 mL | Freq: Once | INTRAVENOUS | Status: AC | PRN
  Administered 2015-08-31: 100 mL via INTRAVENOUS

## 2015-09-10 ENCOUNTER — Other Ambulatory Visit: Payer: Self-pay | Admitting: General Surgery

## 2015-09-10 DIAGNOSIS — R1031 Right lower quadrant pain: Secondary | ICD-10-CM

## 2015-09-21 ENCOUNTER — Other Ambulatory Visit: Payer: Self-pay | Admitting: General Surgery

## 2015-09-21 DIAGNOSIS — R1031 Right lower quadrant pain: Secondary | ICD-10-CM

## 2015-09-23 ENCOUNTER — Other Ambulatory Visit: Payer: BLUE CROSS/BLUE SHIELD

## 2015-09-29 ENCOUNTER — Ambulatory Visit
Admission: RE | Admit: 2015-09-29 | Discharge: 2015-09-29 | Disposition: A | Discharge status: Home / Self Care | Payer: BLUE CROSS/BLUE SHIELD | Source: Ambulatory Visit | Attending: General Surgery | Admitting: General Surgery

## 2015-09-29 ENCOUNTER — Other Ambulatory Visit: Payer: Self-pay | Admitting: General Surgery

## 2015-09-29 ENCOUNTER — Other Ambulatory Visit: Payer: BLUE CROSS/BLUE SHIELD

## 2015-09-29 DIAGNOSIS — R1031 Right lower quadrant pain: Secondary | ICD-10-CM

## 2016-03-01 DIAGNOSIS — Z23 Encounter for immunization: Secondary | ICD-10-CM | POA: Diagnosis not present

## 2016-03-01 DIAGNOSIS — Z13220 Encounter for screening for lipoid disorders: Secondary | ICD-10-CM | POA: Diagnosis not present

## 2016-03-01 DIAGNOSIS — Z125 Encounter for screening for malignant neoplasm of prostate: Secondary | ICD-10-CM | POA: Diagnosis not present

## 2016-03-01 DIAGNOSIS — Z0000 Encounter for general adult medical examination without abnormal findings: Secondary | ICD-10-CM | POA: Diagnosis not present

## 2016-06-03 DIAGNOSIS — Z23 Encounter for immunization: Secondary | ICD-10-CM | POA: Diagnosis not present

## 2017-01-08 ENCOUNTER — Ambulatory Visit: Payer: Self-pay | Admitting: General Surgery

## 2017-01-08 DIAGNOSIS — K4090 Unilateral inguinal hernia, without obstruction or gangrene, not specified as recurrent: Secondary | ICD-10-CM | POA: Diagnosis not present

## 2017-01-10 DIAGNOSIS — D2261 Melanocytic nevi of right upper limb, including shoulder: Secondary | ICD-10-CM | POA: Diagnosis not present

## 2017-01-10 DIAGNOSIS — D485 Neoplasm of uncertain behavior of skin: Secondary | ICD-10-CM | POA: Diagnosis not present

## 2017-01-10 DIAGNOSIS — S30860A Insect bite (nonvenomous) of lower back and pelvis, initial encounter: Secondary | ICD-10-CM | POA: Diagnosis not present

## 2017-01-10 DIAGNOSIS — D2271 Melanocytic nevi of right lower limb, including hip: Secondary | ICD-10-CM | POA: Diagnosis not present

## 2017-01-10 DIAGNOSIS — D225 Melanocytic nevi of trunk: Secondary | ICD-10-CM | POA: Diagnosis not present

## 2017-01-10 DIAGNOSIS — W57XXXA Bitten or stung by nonvenomous insect and other nonvenomous arthropods, initial encounter: Secondary | ICD-10-CM | POA: Diagnosis not present

## 2017-01-10 DIAGNOSIS — D224 Melanocytic nevi of scalp and neck: Secondary | ICD-10-CM | POA: Diagnosis not present

## 2017-01-17 ENCOUNTER — Encounter (HOSPITAL_COMMUNITY)
Admission: RE | Admit: 2017-01-17 | Discharge: 2017-01-17 | Disposition: A | Discharge status: Home / Self Care | Payer: BLUE CROSS/BLUE SHIELD | Source: Ambulatory Visit | Attending: General Surgery | Admitting: General Surgery

## 2017-01-17 ENCOUNTER — Encounter (HOSPITAL_COMMUNITY): Payer: Self-pay

## 2017-01-17 DIAGNOSIS — Z01812 Encounter for preprocedural laboratory examination: Secondary | ICD-10-CM | POA: Insufficient documentation

## 2017-01-17 DIAGNOSIS — K4090 Unilateral inguinal hernia, without obstruction or gangrene, not specified as recurrent: Secondary | ICD-10-CM | POA: Insufficient documentation

## 2017-01-17 HISTORY — DX: Pneumonia, unspecified organism: J18.9

## 2017-01-17 LAB — CBC
HCT: 42.1 % (ref 39.0–52.0)
Hemoglobin: 14.3 g/dL (ref 13.0–17.0)
MCH: 32.1 pg (ref 26.0–34.0)
MCHC: 34 g/dL (ref 30.0–36.0)
MCV: 94.6 fL (ref 78.0–100.0)
PLATELETS: 283 10*3/uL (ref 150–400)
RBC: 4.45 MIL/uL (ref 4.22–5.81)
RDW: 12.8 % (ref 11.5–15.5)
WBC: 5.5 10*3/uL (ref 4.0–10.5)

## 2017-01-17 NOTE — Progress Notes (Signed)
Dr c Maple Hudsonmoser called to consult with patient re: severe nausea and vomiting with anesthesia.

## 2017-01-17 NOTE — Pre-Procedure Instructions (Addendum)
Genice Rougeeter K Riding  01/17/2017      [id number] [place] - [address] [place] [id number] Phone: [phone] Fax: [phone]    Your procedure is scheduled on 01/26/17.  [facility] at [facility] A.M.  Call this number if you have problems the morning of surgery:  [phone]   Remember:  Do not eat food or drink liquids after midnight.  Take these medicines the morning of surgery with A SIP OF WATER        Inhaler and clonazepam if needed(bring inhaler)  Breeze drink given to patient to drink at 500 am day of surgery  STOP all herbel meds, nsaids (aleve,naproxen,advil,ibuprofen) 7 days prior to surgery starting 01/19/17 including all vitamins/supplements,aspirin, BC fast pain relief   Do not wear jewelry, make-up or nail polish.  Do not wear lotions, powders, or perfumes, or deoderant.  Do not shave 48 hours prior to surgery.  Men may shave face and neck.  Do not bring valuables to the hospital.  [facility] is not responsible for any belongings or valuables.  Contacts, dentures or bridgework may not be worn into surgery.  Leave your suitcase in the car.  After surgery it may be brought to your room.  For patients admitted to the hospital, discharge time will be determined by your treatment team.  Patients discharged the day of surgery will not be allowed to drive home.   Special instructions:   Special Instructions: [place] - Preparing for Surgery  Before surgery, you can play an important role.  Because skin is not sterile, your skin needs to be as free of germs as possible.  You can reduce the number of germs on you skin by washing with CHG (chlorahexidine gluconate) soap before surgery.  CHG is an antiseptic cleaner which kills germs and bonds with the skin to continue killing germs even after washing.  Please DO NOT use if you have an allergy to CHG or antibacterial soaps.  If your skin becomes  reddened/irritated stop using the CHG and inform your nurse when you arrive at Short Stay.  Do not shave (including legs and underarms) for at least 48 hours prior to the first CHG shower.  You may shave your face.  Please follow these instructions carefully:   1.  Shower with CHG Soap the night before surgery and the morning of Surgery.  2.  If you choose to wash your hair, wash your hair first as usual with your normal shampoo.  3.  After you shampoo, rinse your hair and body thoroughly to remove the Shampoo.  4.  Use CHG as you would any other liquid soap.  You can apply chg directly  to the skin and wash gently with scrungie or a clean washcloth.  5.  Apply the CHG Soap to your body ONLY FROM THE NECK DOWN.  Do not use on open wounds or open sores.  Avoid contact with your eyes ears, mouth and genitals (private parts).  Wash genitals (private parts)       with your normal soap.  6.  Wash thoroughly, paying special attention to the area where your surgery will be performed.  7.  Thoroughly rinse your body with warm water from the neck down.  8.  DO NOT shower/wash with your normal soap after using and rinsing off the CHG Soap.  9.  Pat yourself dry with a clean towel.  10.  Wear clean pajamas.            11.  Place clean sheets on your bed the night of your first shower and do not sleep with pets.  Day of Surgery  Do not apply any lotions/deodorants the morning of surgery.  Please wear clean clothes to the hospital/surgery center.  Please read over the fact sheets that you were given.

## 2017-01-17 NOTE — Anesthesia Preprocedure Evaluation (Addendum)
Anesthesia Evaluation  Patient identified by MRN, date of birth, ID band Patient awake    Reviewed: Allergy & Precautions, NPO status , Patient's Chart, lab work & pertinent test results  History of Anesthesia Complications (+) PONV and history of anesthetic complications  Airway Mallampati: II  TM Distance: >3 FB Neck ROM: Full    Dental  (+) Teeth Intact   Pulmonary asthma ,    breath sounds clear to auscultation       Cardiovascular negative cardio ROS   Rhythm:Regular     Neuro/Psych  Headaches, negative psych ROS   GI/Hepatic negative GI ROS, Neg liver ROS,   Endo/Other  negative endocrine ROS  Renal/GU negative Renal ROS     Musculoskeletal   Abdominal   Peds  Hematology negative hematology ROS (+)   Anesthesia Other Findings   Reproductive/Obstetrics                             Anesthesia Physical Anesthesia Plan  ASA: II  Anesthesia Plan: General   Post-op Pain Management:  Regional for Post-op pain   Induction: Intravenous  Airway Management Planned: LMA  Additional Equipment: None  Intra-op Plan:   Post-operative Plan: Extubation in OR  Informed Consent: I have reviewed the patients History and Physical, chart, labs and discussed the procedure including the risks, benefits and alternatives for the proposed anesthesia with the patient or authorized representative who has indicated his/her understanding and acceptance.   Dental advisory given  Plan Discussed with: CRNA and Surgeon  Anesthesia Plan Comments: (Severe PONV: LMA with TAP block and propofol gtt for prophylaxis)      Anesthesia Quick Evaluation

## 2017-01-26 ENCOUNTER — Ambulatory Visit (HOSPITAL_COMMUNITY): Payer: BLUE CROSS/BLUE SHIELD | Admitting: Vascular Surgery

## 2017-01-26 ENCOUNTER — Encounter (HOSPITAL_COMMUNITY)
Admission: RE | Disposition: A | Discharge status: Home / Self Care | Payer: Self-pay | Source: Ambulatory Visit | Attending: General Surgery

## 2017-01-26 ENCOUNTER — Ambulatory Visit (HOSPITAL_COMMUNITY)
Admission: RE | Admit: 2017-01-26 | Discharge: 2017-01-26 | Disposition: A | Discharge status: Home / Self Care | Payer: BLUE CROSS/BLUE SHIELD | Source: Ambulatory Visit | Attending: General Surgery | Admitting: General Surgery

## 2017-01-26 ENCOUNTER — Ambulatory Visit (HOSPITAL_COMMUNITY): Payer: BLUE CROSS/BLUE SHIELD | Admitting: Anesthesiology

## 2017-01-26 ENCOUNTER — Encounter (HOSPITAL_COMMUNITY): Payer: Self-pay | Admitting: *Deleted

## 2017-01-26 DIAGNOSIS — Z7951 Long term (current) use of inhaled steroids: Secondary | ICD-10-CM | POA: Insufficient documentation

## 2017-01-26 DIAGNOSIS — Z7982 Long term (current) use of aspirin: Secondary | ICD-10-CM | POA: Diagnosis not present

## 2017-01-26 DIAGNOSIS — K4090 Unilateral inguinal hernia, without obstruction or gangrene, not specified as recurrent: Secondary | ICD-10-CM | POA: Diagnosis not present

## 2017-01-26 DIAGNOSIS — Z79899 Other long term (current) drug therapy: Secondary | ICD-10-CM | POA: Insufficient documentation

## 2017-01-26 DIAGNOSIS — Z791 Long term (current) use of non-steroidal anti-inflammatories (NSAID): Secondary | ICD-10-CM | POA: Diagnosis not present

## 2017-01-26 DIAGNOSIS — G8918 Other acute postprocedural pain: Secondary | ICD-10-CM | POA: Diagnosis not present

## 2017-01-26 HISTORY — PX: INSERTION OF MESH: SHX5868

## 2017-01-26 HISTORY — PX: INGUINAL HERNIA REPAIR: SHX194

## 2017-01-26 SURGERY — REPAIR, HERNIA, INGUINAL, ADULT
Anesthesia: Regional | Site: Groin | Laterality: Left

## 2017-01-26 MED ORDER — BUPIVACAINE LIPOSOME 1.3 % IJ SUSP
20.0000 mL | INTRAMUSCULAR | Status: AC
  Administered 2017-01-26: 20 mL
  Filled 2017-01-26: qty 20

## 2017-01-26 MED ORDER — OXYCODONE HCL 5 MG/5ML PO SOLN: 5.0000 mg | Freq: Once | ORAL | Status: DC | PRN

## 2017-01-26 MED ORDER — MIDAZOLAM HCL 5 MG/5ML IJ SOLN
INTRAMUSCULAR | Status: DC | PRN
  Administered 2017-01-26 (×2): 2 mg via INTRAVENOUS

## 2017-01-26 MED ORDER — BUPIVACAINE HCL (PF) 0.25 % IJ SOLN
INTRAMUSCULAR | Status: DC | PRN
  Administered 2017-01-26: 10 mL

## 2017-01-26 MED ORDER — FENTANYL CITRATE (PF) 100 MCG/2ML IJ SOLN
INTRAMUSCULAR | Status: DC | PRN
  Administered 2017-01-26: 100 ug via INTRAVENOUS

## 2017-01-26 MED ORDER — MIDAZOLAM HCL 2 MG/2ML IJ SOLN
INTRAMUSCULAR | Status: AC
  Filled 2017-01-26: qty 2

## 2017-01-26 MED ORDER — CEFAZOLIN SODIUM-DEXTROSE 2-4 GM/100ML-% IV SOLN
2.0000 g | INTRAVENOUS | Status: AC
  Administered 2017-01-26: 2 g via INTRAVENOUS
  Filled 2017-01-26: qty 100

## 2017-01-26 MED ORDER — CELECOXIB 200 MG PO CAPS
400.0000 mg | ORAL_CAPSULE | ORAL | Status: AC
  Administered 2017-01-26: 400 mg via ORAL
  Filled 2017-01-26: qty 2

## 2017-01-26 MED ORDER — ONDANSETRON HCL 4 MG/2ML IJ SOLN
INTRAMUSCULAR | Status: DC | PRN
  Administered 2017-01-26: 4 mg via INTRAVENOUS

## 2017-01-26 MED ORDER — DIPHENHYDRAMINE HCL 50 MG/ML IJ SOLN
INTRAMUSCULAR | Status: DC | PRN
  Administered 2017-01-26: 25 mg via INTRAVENOUS

## 2017-01-26 MED ORDER — 0.9 % SODIUM CHLORIDE (POUR BTL) OPTIME
TOPICAL | Status: DC | PRN
  Administered 2017-01-26: 1000 mL

## 2017-01-26 MED ORDER — PROPOFOL 10 MG/ML IV BOLUS
INTRAVENOUS | Status: AC
  Filled 2017-01-26: qty 20

## 2017-01-26 MED ORDER — CHLORHEXIDINE GLUCONATE CLOTH 2 % EX PADS: 6.0000 | MEDICATED_PAD | Freq: Once | CUTANEOUS | Status: DC

## 2017-01-26 MED ORDER — PROMETHAZINE HCL 25 MG/ML IJ SOLN
INTRAMUSCULAR | Status: DC | PRN
  Administered 2017-01-26: 12.5 mg via INTRAVENOUS

## 2017-01-26 MED ORDER — BUPIVACAINE HCL (PF) 0.25 % IJ SOLN
INTRAMUSCULAR | Status: AC
  Filled 2017-01-26: qty 30

## 2017-01-26 MED ORDER — LACTATED RINGERS IV SOLN
INTRAVENOUS | Status: DC
  Administered 2017-01-26 (×3): via INTRAVENOUS

## 2017-01-26 MED ORDER — FENTANYL CITRATE (PF) 250 MCG/5ML IJ SOLN
INTRAMUSCULAR | Status: AC
  Filled 2017-01-26: qty 5

## 2017-01-26 MED ORDER — OXYCODONE HCL 5 MG PO TABS: 5.0000 mg | ORAL_TABLET | Freq: Once | ORAL | Status: DC | PRN

## 2017-01-26 MED ORDER — CHLORHEXIDINE GLUCONATE CLOTH 2 % EX PADS
6.0000 | MEDICATED_PAD | Freq: Once | CUTANEOUS | Status: DC
Start: 2017-01-26 — End: 2017-01-26

## 2017-01-26 MED ORDER — HYDROCODONE-ACETAMINOPHEN 5-325 MG PO TABS: 1.0000 | ORAL_TABLET | ORAL | 0 refills | Status: DC | PRN

## 2017-01-26 MED ORDER — PROPOFOL 10 MG/ML IV BOLUS
INTRAVENOUS | Status: DC | PRN
  Administered 2017-01-26 (×3): 50 mg via INTRAVENOUS
  Administered 2017-01-26: 200 mg via INTRAVENOUS

## 2017-01-26 MED ORDER — GABAPENTIN 300 MG PO CAPS
300.0000 mg | ORAL_CAPSULE | ORAL | Status: AC
  Administered 2017-01-26: 300 mg via ORAL
  Filled 2017-01-26: qty 1

## 2017-01-26 MED ORDER — FENTANYL CITRATE (PF) 100 MCG/2ML IJ SOLN: 25.0000 ug | INTRAMUSCULAR | Status: DC | PRN

## 2017-01-26 MED ORDER — SCOPOLAMINE 1 MG/3DAYS TD PT72
MEDICATED_PATCH | TRANSDERMAL | Status: DC | PRN
  Administered 2017-01-26: 1 via TRANSDERMAL

## 2017-01-26 MED ORDER — MIDAZOLAM HCL 2 MG/2ML IJ SOLN
INTRAMUSCULAR | Status: AC
  Filled 2017-01-26: qty 4

## 2017-01-26 MED ORDER — DEXAMETHASONE SODIUM PHOSPHATE 10 MG/ML IJ SOLN
INTRAMUSCULAR | Status: DC | PRN
  Administered 2017-01-26: 10 mg via INTRAVENOUS

## 2017-01-26 MED ORDER — PROPOFOL 1000 MG/100ML IV EMUL
INTRAVENOUS | Status: AC
  Filled 2017-01-26: qty 100

## 2017-01-26 MED ORDER — PROPOFOL 500 MG/50ML IV EMUL
INTRAVENOUS | Status: DC | PRN
  Administered 2017-01-26: 300 ug/kg/min via INTRAVENOUS
  Administered 2017-01-26: 200 ug/kg/min via INTRAVENOUS

## 2017-01-26 MED ORDER — ONDANSETRON 4 MG PO TBDP: 4.0000 mg | ORAL_TABLET | Freq: Three times a day (TID) | ORAL | 0 refills | Status: DC | PRN

## 2017-01-26 MED ORDER — FENTANYL CITRATE (PF) 100 MCG/2ML IJ SOLN
INTRAMUSCULAR | Status: DC
Start: 2017-01-26 — End: 2017-01-26
  Filled 2017-01-26: qty 2

## 2017-01-26 MED ORDER — ACETAMINOPHEN 500 MG PO TABS
1000.0000 mg | ORAL_TABLET | ORAL | Status: AC
  Administered 2017-01-26: 1000 mg via ORAL
  Filled 2017-01-26: qty 2

## 2017-01-26 MED ORDER — LIDOCAINE HCL (CARDIAC) 20 MG/ML IV SOLN
INTRAVENOUS | Status: DC | PRN
  Administered 2017-01-26: 60 mg via INTRAVENOUS

## 2017-01-26 MED ORDER — SCOPOLAMINE 1 MG/3DAYS TD PT72
MEDICATED_PATCH | TRANSDERMAL | Status: AC
  Filled 2017-01-26: qty 1

## 2017-01-26 SURGICAL SUPPLY — 40 items
BLADE CLIPPER SURG (BLADE) ×2 IMPLANT
CHLORAPREP W/TINT 26ML (MISCELLANEOUS) ×2 IMPLANT
COVER SURGICAL LIGHT HANDLE (MISCELLANEOUS) ×2 IMPLANT
DECANTER SPIKE VIAL GLASS SM (MISCELLANEOUS) ×2 IMPLANT
DERMABOND ADVANCED (GAUZE/BANDAGES/DRESSINGS) ×1
DERMABOND ADVANCED .7 DNX12 (GAUZE/BANDAGES/DRESSINGS) ×1 IMPLANT
DRAIN PENROSE 1/2X12 LTX STRL (WOUND CARE) ×2 IMPLANT
DRAPE LAPAROSCOPIC ABDOMINAL (DRAPES) ×2 IMPLANT
DRAPE UTILITY XL STRL (DRAPES) ×2 IMPLANT
ELECT CAUTERY BLADE 6.4 (BLADE) ×2 IMPLANT
ELECT REM PT RETURN 9FT ADLT (ELECTROSURGICAL) ×2
ELECTRODE REM PT RTRN 9FT ADLT (ELECTROSURGICAL) ×1 IMPLANT
GLOVE BIO SURGEON STRL SZ7.5 (GLOVE) ×2 IMPLANT
GOWN STRL REUS W/ TWL LRG LVL3 (GOWN DISPOSABLE) ×2 IMPLANT
GOWN STRL REUS W/TWL LRG LVL3 (GOWN DISPOSABLE) ×2
KIT BASIN OR (CUSTOM PROCEDURE TRAY) ×2 IMPLANT
KIT ROOM TURNOVER OR (KITS) ×2 IMPLANT
MESH ULTRAPRO 3X6 7.6X15CM (Mesh General) ×2 IMPLANT
NEEDLE HYPO 25GX1X1/2 BEV (NEEDLE) ×4 IMPLANT
NS IRRIG 1000ML POUR BTL (IV SOLUTION) ×2 IMPLANT
PACK SURGICAL SETUP 50X90 (CUSTOM PROCEDURE TRAY) ×2 IMPLANT
PAD ARMBOARD 7.5X6 YLW CONV (MISCELLANEOUS) ×4 IMPLANT
PENCIL BUTTON HOLSTER BLD 10FT (ELECTRODE) ×2 IMPLANT
SPONGE LAP 18X18 X RAY DECT (DISPOSABLE) ×2 IMPLANT
SUT MNCRL AB 4-0 PS2 18 (SUTURE) ×2 IMPLANT
SUT PROLENE 2 0 SH DA (SUTURE) ×4 IMPLANT
SUT SILK 2 0 SH (SUTURE) IMPLANT
SUT SILK 3 0 (SUTURE) ×1
SUT SILK 3-0 18XBRD TIE 12 (SUTURE) ×1 IMPLANT
SUT VIC AB 0 CT1 27 (SUTURE) ×1
SUT VIC AB 0 CT1 27XBRD ANBCTR (SUTURE) ×1 IMPLANT
SUT VIC AB 2-0 SH 27 (SUTURE) ×1
SUT VIC AB 2-0 SH 27X BRD (SUTURE) ×1 IMPLANT
SUT VIC AB 3-0 SH 27 (SUTURE) ×1
SUT VIC AB 3-0 SH 27XBRD (SUTURE) ×1 IMPLANT
SYR BULB 3OZ (MISCELLANEOUS) ×2 IMPLANT
SYR CONTROL 10ML LL (SYRINGE) ×4 IMPLANT
TOWEL OR 17X24 6PK STRL BLUE (TOWEL DISPOSABLE) ×2 IMPLANT
TOWEL OR 17X26 10 PK STRL BLUE (TOWEL DISPOSABLE) ×2 IMPLANT
WATER STERILE IRR 1000ML POUR (IV SOLUTION) IMPLANT

## 2017-01-26 NOTE — H&P (Signed)
Mark Atkins  [facility] Patient #: [id number] DOB: Sep 11, 1961 Married / Language: English / Race: White Male   History of Present Illness  The patient is a 56 year old male who presents for a follow-up for Inguinal hernia. The patient is a 56 year old white male who is about 6 years status post right inguinal hernia repair with mesh. The surgery was complicated by significant postoperative nausea and vomiting. He has been well and denies any significant right groin pain. Over the last year more he is been noticing that there is a bulge in the left groin that is gradually been getting bigger. He has some occasional pain associated with it. He denies any nausea or vomiting. He denies any fevers or chills.   Allergies No Known Drug Allergies   Medication History  Advair Diskus (100-50MCG/DOSE Aero Pow Br Act, Inhalation) Active. Fish Oil (1000MG  Capsule, Oral) Active. Multivitamins (Oral) Active. Nasacort (55MCG/ACT Aerosol Soln, Nasal) Active. Osteo Bi-Flex Regular Strength (250-200MG  Tablet, Oral) Active. Aspirin (81MG  Tablet, Oral) Active. Ibuprofen (200MG  Tablet, Oral) Active. ClonazePAM (0.5MG  Tablet, Oral) Active. Medications Reconciled    Review of Systems  General Not Present- Appetite Loss, Chills, Fatigue, Fever, Night Sweats, Weight Gain and Weight Loss. Skin Not Present- Change in Wart/Mole, Dryness, Hives, Jaundice, New Lesions, Non-Healing Wounds, Rash and Ulcer. HEENT Not Present- Earache, Hearing Loss, Hoarseness, Nose Bleed, Oral Ulcers, Ringing in the Ears, Seasonal Allergies, Sinus Pain, Sore Throat, Visual Disturbances, Wears glasses/contact lenses and Yellow Eyes. Respiratory Not Present- Bloody sputum, Chronic Cough, Difficulty Breathing, Snoring and Wheezing. Breast Not Present- Breast Mass, Breast Pain, Nipple Discharge and Skin Changes. Cardiovascular Not Present- Chest Pain, Difficulty Breathing Lying Down, Leg Cramps,  Palpitations, Rapid Heart Rate, Shortness of Breath and Swelling of Extremities. Gastrointestinal Not Present- Abdominal Pain, Bloating, Bloody Stool, Change in Bowel Habits, Chronic diarrhea, Constipation, Difficulty Swallowing, Excessive gas, Gets full quickly at meals, Hemorrhoids, Indigestion, Nausea, Rectal Pain and Vomiting. Male Genitourinary Not Present- Blood in Urine, Change in Urinary Stream, Frequency, Impotence, Nocturia, Painful Urination, Urgency and Urine Leakage. Musculoskeletal Not Present- Back Pain, Joint Pain, Joint Stiffness, Muscle Pain, Muscle Weakness and Swelling of Extremities. Neurological Not Present- Decreased Memory, Fainting, Headaches, Numbness, Seizures, Tingling, Tremor, Trouble walking and Weakness. Psychiatric Not Present- Anxiety, Bipolar, Change in Sleep Pattern, Depression, Fearful and Frequent crying. Endocrine Not Present- Cold Intolerance, Excessive Hunger, Hair Changes, Heat Intolerance and New Diabetes. Hematology Not Present- Easy Bruising, Excessive bleeding, Gland problems, HIV and Persistent Infections.  Vitals Weight: 178.8 lb Height: 70in Body Surface Area: 1.99 m Body Mass Index: 25.65 kg/m  Temp.: 98.73F  Pulse: 71 (Regular)  BP: 132/80 (Sitting, Left Arm, Standard)       Physical Exam  General Mental Status-Alert. General Appearance-Consistent with stated age. Hydration-Well hydrated. Voice-Normal.  Head and Neck Head-normocephalic, atraumatic with no lesions or palpable masses. Trachea-midline. Thyroid Gland Characteristics - normal size and consistency.  Eye Eyeball - Bilateral-Extraocular movements intact. Sclera/Conjunctiva - Bilateral-No scleral icterus.  Chest and Lung Exam Chest and lung exam reveals -quiet, even and easy respiratory effort with no use of accessory muscles and on auscultation, normal breath sounds, no adventitious sounds and normal vocal resonance. Inspection Chest  Wall - Normal. Back - normal.  Cardiovascular Cardiovascular examination reveals -normal heart sounds, regular rate and rhythm with no murmurs and normal pedal pulses bilaterally.  Abdomen Inspection Inspection of the abdomen reveals - No Hernias. Skin - Scar - no surgical scars. Palpation/Percussion Palpation and Percussion of the  abdomen reveal - Soft, Non Tender, No Rebound tenderness, No Rigidity (guarding) and No hepatosplenomegaly. Auscultation Auscultation of the abdomen reveals - Bowel sounds normal.  Male Genitourinary Note: There is a palpable bulge in the left groin that reduces easily. There is no palpable bulge or impulse was straining in the right groin. The right groin incision has healed nicely with no sign of infection. The abdominal wall on the right feels very solid with no palpable evidence of recurrence of the hernia.   Neurologic Neurologic evaluation reveals -alert and oriented x 3 with no impairment of recent or remote memory. Mental Status-Normal.  Musculoskeletal Normal Exam - Left-Upper Extremity Strength Normal and Lower Extremity Strength Normal. Normal Exam - Right-Upper Extremity Strength Normal and Lower Extremity Strength Normal.  Lymphatic Head & Neck  General Head & Neck Lymphatics: Bilateral - Description - Normal. Axillary  General Axillary Region: Bilateral - Description - Normal. Tenderness - Non Tender. Femoral & Inguinal  Generalized Femoral & Inguinal Lymphatics: Bilateral - Description - Normal. Tenderness - Non Tender.    Assessment & Plan  INGUINAL HERNIA OF LEFT SIDE WITHOUT OBSTRUCTION OR GANGRENE (K40.90) Impression: The patient appears to have a small but symptomatic left inguinal hernia. Because the risk of incarceration and strangulation or think he would benefit from having the hernia fixed. He would also like to have this done. I have discussed with him in detail the risks and benefits of the operation to fix the  hernia as well as some of the technical aspects and he understands and wishes to proceed. He had significant postoperative nausea when we fixed his right side so we will obtain a preoperative anesthesia consult and hopefully the enhanced recovery pathways will help with this situation. Current Plans Pt Education - Hernia: discussed with patient and provided information.

## 2017-01-26 NOTE — Op Note (Signed)
01/26/2017  10:59 AM  PATIENT:  Mark Atkins  56 y.o. male  PRE-OPERATIVE DIAGNOSIS:  left inguinal hernia  POST-OPERATIVE DIAGNOSIS:  left inguinal hernia  PROCEDURE:  Procedure(s): LEFT INGUINAL HERNIA REPAIR WITH MESH (Left) INSERTION OF MESH (Left)  SURGEON:  Surgeon(s) and Role:    * Griselda Mineroth, Chalsea Darko III, MD - Primary  PHYSICIAN ASSISTANT:   ASSISTANTS: none   ANESTHESIA:   local and general  EBL:  Total I/O In: 1000 [I.V.:1000] Out: -   BLOOD ADMINISTERED:none  DRAINS: none   LOCAL MEDICATIONS USED:  MARCAINE     SPECIMEN:  No Specimen  DISPOSITION OF SPECIMEN:  N/A  COUNTS:  YES  TOURNIQUET:  * No tourniquets in log *  DICTATION: .Dragon Dictation   After informed consent was obtained the patient was brought to the operating room and placed in the supine position on the operating table. After adequate induction of general anesthesia the patient's abdomen and left groin were prepped with ChloraPrep, allowed to dry, and draped in usual sterile manner. Timeout was performed. The left groin was then infiltrated with quarter percent Marcaine. A small incision was made from the edge of the pubic tubercle in the left groin towards the anterior superior iliac spine with a 15 blade knife. The incision was carried through the skin and subcutaneous tissue sharply with electrocautery until the fascia of the external oblique was encountered. 2 small bridging veins were clamped with hemostats, divided, and ligated with 3-0 silk ties. The external oblique fascia was opened along its fibers towards the apex of the external ring with a 15 blade knife and Metzenbaum scissors. Blunt dissection was carried out of the cord structures until they could be surrounded between 2 fingers. A half-inch Penrose drain was placed around the cord structures for retraction purposes. The ilioinguinal nerve was identified and was in good position along the cord. The ilioinguinal nerve was left intact. The sac  was gently skeletonized by blunt hemostat dissection. I was able to identify a small hernia sac and a moderate size lipoma of the cord. The sac was small and broad-based. It was easily reduced back beneath the floor of the canal. The lipoma was ligated at its location with a hemostat and removed. The stump was ligated with a 3-0 silk tie. The floor of the canal was then reapproximated with interrupted 0 Vicryl stitches. Next a 3 x 6 piece of ultra Pro mesh was chosen and cut to the appropriate size. Tails were cut in the mesh laterally. The mesh was sewn inferiorly to the shelving edge of the inguinal ligament with a running 2-0 Prolene stitch. The tails were wrapped around the cord structures. Superiorly the mesh was anchored to the apron around extraction of the transversalis with interrupted 2-0 Prolene vertical mattress stitches. Care was taken to avoid the iliofemoral nerve along the floor of the canal. The tails of the mesh were anchored lateral to the cord to the shelving edge of the inguinal ligament with interrupted 2-0 Prolene stitch. Once this was accomplished the mesh was in good position and the hernia seemed well repaired. A 2-0 Prolene stitch was also placed at the crotch of the tails to prevent any opening of the mesh. The wound was then irrigated with copious amounts of saline. The external oblique fascia was closed with a running 2-0 Vicryl stitch. The wound was infiltrated with Exparel. The subcutaneous fascia was closed with a running 3-0 Vicryl stitch. The skin was then closed with a running  4-0 Monocryl subcuticular stitch. Dermabond dressings were applied. The patient tolerated the procedure well. At the end of the case all needle sponge and instrument counts were correct. The patient was then awakened and taken to recovery in stable condition.  PLAN OF CARE: Discharge to home after PACU  PATIENT DISPOSITION:  PACU - hemodynamically stable.   Delay start of Pharmacological VTE agent  (>24hrs) due to surgical blood loss or risk of bleeding: not applicable

## 2017-01-26 NOTE — Anesthesia Procedure Notes (Signed)
Procedure Name: LMA Insertion Date/Time: 01/26/2017 9:43 AM Performed by: Val EagleMOSER, CHRISTOPHER Pre-anesthesia Checklist: Patient identified, Emergency Drugs available, Suction available and Patient being monitored Patient Re-evaluated:Patient Re-evaluated prior to inductionOxygen Delivery Method: Circle System Utilized Preoxygenation: Pre-oxygenation with 100% oxygen Intubation Type: IV induction Ventilation: Mask ventilation without difficulty LMA: LMA inserted LMA Size: 5.0 Number of attempts: 1 Airway Equipment and Method: Bite block Placement Confirmation: positive ETCO2 Tube secured with: Tape Dental Injury: Teeth and Oropharynx as per pre-operative assessment

## 2017-01-26 NOTE — Anesthesia Postprocedure Evaluation (Addendum)
Anesthesia Post Note  Patient: Davina Pokeeter K Hable  Procedure(s) Performed: Procedure(s) (LRB): LEFT INGUINAL HERNIA REPAIR WITH MESH (Left) INSERTION OF MESH (Left)  Patient location during evaluation: PACU Anesthesia Type: Regional Level of consciousness: awake and alert Pain management: pain level controlled Vital Signs Assessment: post-procedure vital signs reviewed and stable Respiratory status: spontaneous breathing, nonlabored ventilation, respiratory function stable and patient connected to nasal cannula oxygen Cardiovascular status: blood pressure returned to baseline and stable Postop Assessment: no signs of nausea or vomiting Anesthetic complications: no       Last Vitals:  Vitals:   01/26/17 1313 01/26/17 1315  BP: 107/76 107/76  Pulse: (!) 58 (!) 56  Resp: 19 12  Temp: 36.9 C     Last Pain:  Vitals:   01/26/17 1225  TempSrc:   PainSc: 0-No pain                 Alexandera Kuntzman

## 2017-01-26 NOTE — Anesthesia Procedure Notes (Deleted)
Procedure Name: LMA Insertion Performed by: Samara DeistBECKNER, Kree Rafter B Pre-anesthesia Checklist: Emergency Drugs available, Patient identified, Suction available, Patient being monitored and Timeout performed Patient Re-evaluated:Patient Re-evaluated prior to inductionOxygen Delivery Method: Circle system utilized Intubation Type: IV induction LMA: LMA inserted LMA Size: 5.0 Number of attempts: 1 Placement Confirmation: positive ETCO2 and breath sounds checked- equal and bilateral Tube secured with: Tape Dental Injury: Teeth and Oropharynx as per pre-operative assessment

## 2017-01-26 NOTE — Anesthesia Procedure Notes (Signed)
Anesthesia Regional Block: TAP block   Pre-Anesthetic Checklist: ,, timeout performed, Correct Patient, Correct Site, Correct Laterality, Correct Procedure, Correct Position, risks and benefits discussed, surgical consent, pre-op evaluation,  At surgeon's request and post-op pain management  Laterality: N/A and Left  Prep: chloraprep       Needles:  Injection technique: Single-shot  Needle Type: Echogenic Needle          Additional Needles:   Procedures: ultrasound guided,,,,,,,,  Narrative:  Start time: 01/26/2017 9:48 AM End time: 01/26/2017 9:52 AM Injection made incrementally with aspirations every 5 mL.  Performed by: Personally   Additional Notes: H+P and labs reviewed, risks and benefits discussed with patient, procedure tolerated well without complications

## 2017-01-26 NOTE — Interval H&P Note (Signed)
History and Physical Interval Note:  01/26/2017 8:48 AM  Mark PokePeter K Rebman  has presented today for surgery, with the diagnosis of left inguinal hernia  The various methods of treatment have been discussed with the patient and family. After consideration of risks, benefits and other options for treatment, the patient has consented to  Procedure(s): LEFT INGUINAL HERNIA REPAIR WITH MESH (Left) INSERTION OF MESH (Left) as a surgical intervention .  The patient's history has been reviewed, patient examined, no change in status, stable for surgery.  I have reviewed the patient's chart and labs.  Questions were answered to the patient's satisfaction.     TOTH III,[name]

## 2017-01-26 NOTE — Transfer of Care (Signed)
Immediate Anesthesia Transfer of Care Note  Patient: Mark Atkins  Procedure(s) Performed: Procedure(s): LEFT INGUINAL HERNIA REPAIR WITH MESH (Left) INSERTION OF MESH (Left)  Patient Location: PACU  Anesthesia Type:GA combined with regional for post-op pain  Level of Consciousness: sedated  Airway & Oxygen Therapy: Patient Spontanous Breathing and Patient connected to nasal cannula oxygen  Post-op Assessment: Report given to RN and Post -op Vital signs reviewed and stable  Post vital signs: Reviewed and stable  Last Vitals:  Vitals:   01/26/17 0717 01/26/17 1116  BP: 124/80 98/60  Pulse: (!) 59 71  Resp: 20 19  Temp: 36.6 C 36.5 C    Last Pain:  Vitals:   01/26/17 1116  TempSrc:   PainSc: (P) Asleep      Patients Stated Pain Goal: 4 (01/26/17 0736)  Complications: No apparent anesthesia complications

## 2017-01-29 ENCOUNTER — Encounter (HOSPITAL_COMMUNITY): Payer: Self-pay | Admitting: General Surgery

## 2017-02-12 NOTE — Addendum Note (Signed)
Addendum  created 02/12/17 1535 by Adonys Wildes, MD   Sign clinical note    

## 2017-03-21 DIAGNOSIS — R1031 Right lower quadrant pain: Secondary | ICD-10-CM | POA: Diagnosis not present

## 2017-06-20 DIAGNOSIS — M2021 Hallux rigidus, right foot: Secondary | ICD-10-CM | POA: Diagnosis not present

## 2017-06-20 DIAGNOSIS — M2022 Hallux rigidus, left foot: Secondary | ICD-10-CM | POA: Diagnosis not present

## 2017-08-07 DIAGNOSIS — M2022 Hallux rigidus, left foot: Secondary | ICD-10-CM | POA: Diagnosis not present

## 2017-08-07 DIAGNOSIS — M2021 Hallux rigidus, right foot: Secondary | ICD-10-CM | POA: Diagnosis not present

## 2017-08-15 DIAGNOSIS — Z23 Encounter for immunization: Secondary | ICD-10-CM | POA: Diagnosis not present

## 2017-12-12 DIAGNOSIS — D224 Melanocytic nevi of scalp and neck: Secondary | ICD-10-CM | POA: Diagnosis not present

## 2017-12-12 DIAGNOSIS — L821 Other seborrheic keratosis: Secondary | ICD-10-CM | POA: Diagnosis not present

## 2017-12-12 DIAGNOSIS — D2261 Melanocytic nevi of right upper limb, including shoulder: Secondary | ICD-10-CM | POA: Diagnosis not present

## 2017-12-12 DIAGNOSIS — D225 Melanocytic nevi of trunk: Secondary | ICD-10-CM | POA: Diagnosis not present

## 2017-12-19 DIAGNOSIS — Z125 Encounter for screening for malignant neoplasm of prostate: Secondary | ICD-10-CM | POA: Diagnosis not present

## 2017-12-19 DIAGNOSIS — F419 Anxiety disorder, unspecified: Secondary | ICD-10-CM | POA: Diagnosis not present

## 2017-12-19 DIAGNOSIS — Z0000 Encounter for general adult medical examination without abnormal findings: Secondary | ICD-10-CM | POA: Diagnosis not present

## 2017-12-19 DIAGNOSIS — J45909 Unspecified asthma, uncomplicated: Secondary | ICD-10-CM | POA: Diagnosis not present

## 2017-12-19 DIAGNOSIS — Z13220 Encounter for screening for lipoid disorders: Secondary | ICD-10-CM | POA: Diagnosis not present

## 2018-01-16 DIAGNOSIS — Z23 Encounter for immunization: Secondary | ICD-10-CM | POA: Diagnosis not present

## 2018-06-19 DIAGNOSIS — Z23 Encounter for immunization: Secondary | ICD-10-CM | POA: Diagnosis not present

## 2019-03-05 DIAGNOSIS — E7800 Pure hypercholesterolemia, unspecified: Secondary | ICD-10-CM | POA: Diagnosis not present

## 2019-03-05 DIAGNOSIS — Z23 Encounter for immunization: Secondary | ICD-10-CM | POA: Diagnosis not present

## 2019-03-05 DIAGNOSIS — Z131 Encounter for screening for diabetes mellitus: Secondary | ICD-10-CM | POA: Diagnosis not present

## 2019-03-05 DIAGNOSIS — Z0000 Encounter for general adult medical examination without abnormal findings: Secondary | ICD-10-CM | POA: Diagnosis not present

## 2019-03-05 DIAGNOSIS — Z125 Encounter for screening for malignant neoplasm of prostate: Secondary | ICD-10-CM | POA: Diagnosis not present

## 2019-06-11 DIAGNOSIS — D225 Melanocytic nevi of trunk: Secondary | ICD-10-CM | POA: Diagnosis not present

## 2019-06-11 DIAGNOSIS — D224 Melanocytic nevi of scalp and neck: Secondary | ICD-10-CM | POA: Diagnosis not present

## 2019-06-11 DIAGNOSIS — D2261 Melanocytic nevi of right upper limb, including shoulder: Secondary | ICD-10-CM | POA: Diagnosis not present

## 2019-06-11 DIAGNOSIS — D485 Neoplasm of uncertain behavior of skin: Secondary | ICD-10-CM | POA: Diagnosis not present

## 2019-06-11 DIAGNOSIS — Z23 Encounter for immunization: Secondary | ICD-10-CM | POA: Diagnosis not present

## 2019-06-11 DIAGNOSIS — D2271 Melanocytic nevi of right lower limb, including hip: Secondary | ICD-10-CM | POA: Diagnosis not present

## 2019-10-06 DIAGNOSIS — Z20822 Contact with and (suspected) exposure to covid-19: Secondary | ICD-10-CM | POA: Diagnosis not present

## 2019-10-06 DIAGNOSIS — Z9189 Other specified personal risk factors, not elsewhere classified: Secondary | ICD-10-CM | POA: Diagnosis not present

## 2020-03-10 DIAGNOSIS — Z1211 Encounter for screening for malignant neoplasm of colon: Secondary | ICD-10-CM | POA: Diagnosis not present

## 2020-03-10 DIAGNOSIS — J4520 Mild intermittent asthma, uncomplicated: Secondary | ICD-10-CM | POA: Diagnosis not present

## 2020-03-10 DIAGNOSIS — Z125 Encounter for screening for malignant neoplasm of prostate: Secondary | ICD-10-CM | POA: Diagnosis not present

## 2020-03-10 DIAGNOSIS — Z0000 Encounter for general adult medical examination without abnormal findings: Secondary | ICD-10-CM | POA: Diagnosis not present

## 2020-03-10 DIAGNOSIS — E7800 Pure hypercholesterolemia, unspecified: Secondary | ICD-10-CM | POA: Diagnosis not present

## 2020-03-10 DIAGNOSIS — F418 Other specified anxiety disorders: Secondary | ICD-10-CM | POA: Diagnosis not present

## 2020-05-29 DIAGNOSIS — R911 Solitary pulmonary nodule: Secondary | ICD-10-CM | POA: Diagnosis not present

## 2020-05-29 DIAGNOSIS — W19XXXA Unspecified fall, initial encounter: Secondary | ICD-10-CM | POA: Diagnosis not present

## 2020-05-29 DIAGNOSIS — R0789 Other chest pain: Secondary | ICD-10-CM | POA: Diagnosis not present

## 2020-05-29 DIAGNOSIS — Y9302 Activity, running: Secondary | ICD-10-CM | POA: Diagnosis not present

## 2020-06-16 DIAGNOSIS — L905 Scar conditions and fibrosis of skin: Secondary | ICD-10-CM | POA: Diagnosis not present

## 2020-06-16 DIAGNOSIS — D485 Neoplasm of uncertain behavior of skin: Secondary | ICD-10-CM | POA: Diagnosis not present

## 2020-06-16 DIAGNOSIS — L578 Other skin changes due to chronic exposure to nonionizing radiation: Secondary | ICD-10-CM | POA: Diagnosis not present

## 2020-06-16 DIAGNOSIS — L821 Other seborrheic keratosis: Secondary | ICD-10-CM | POA: Diagnosis not present

## 2020-06-16 DIAGNOSIS — D225 Melanocytic nevi of trunk: Secondary | ICD-10-CM | POA: Diagnosis not present

## 2020-07-28 DIAGNOSIS — M25562 Pain in left knee: Secondary | ICD-10-CM | POA: Diagnosis not present

## 2020-08-18 DIAGNOSIS — D485 Neoplasm of uncertain behavior of skin: Secondary | ICD-10-CM | POA: Diagnosis not present

## 2020-08-18 DIAGNOSIS — D225 Melanocytic nevi of trunk: Secondary | ICD-10-CM | POA: Diagnosis not present

## 2020-08-18 DIAGNOSIS — L988 Other specified disorders of the skin and subcutaneous tissue: Secondary | ICD-10-CM | POA: Diagnosis not present

## 2020-11-23 ENCOUNTER — Other Ambulatory Visit: Payer: Self-pay | Admitting: Family Medicine

## 2020-11-23 DIAGNOSIS — R911 Solitary pulmonary nodule: Secondary | ICD-10-CM

## 2020-11-24 ENCOUNTER — Other Ambulatory Visit: Payer: Self-pay | Admitting: Orthopaedic Surgery

## 2020-11-24 DIAGNOSIS — M25562 Pain in left knee: Secondary | ICD-10-CM

## 2020-12-08 ENCOUNTER — Other Ambulatory Visit: Payer: Self-pay

## 2020-12-08 ENCOUNTER — Ambulatory Visit
Admission: RE | Admit: 2020-12-08 | Discharge: 2020-12-08 | Disposition: A | Discharge status: Home / Self Care | Payer: BC Managed Care – PPO | Source: Ambulatory Visit | Attending: Family Medicine | Admitting: Family Medicine

## 2020-12-08 ENCOUNTER — Ambulatory Visit
Admission: RE | Admit: 2020-12-08 | Discharge: 2020-12-08 | Disposition: A | Discharge status: Home / Self Care | Payer: BC Managed Care – PPO | Source: Ambulatory Visit | Attending: Orthopaedic Surgery | Admitting: Orthopaedic Surgery

## 2020-12-08 DIAGNOSIS — M25562 Pain in left knee: Secondary | ICD-10-CM | POA: Diagnosis not present

## 2020-12-08 DIAGNOSIS — R918 Other nonspecific abnormal finding of lung field: Secondary | ICD-10-CM | POA: Diagnosis not present

## 2020-12-08 DIAGNOSIS — R911 Solitary pulmonary nodule: Secondary | ICD-10-CM

## 2020-12-15 DIAGNOSIS — M25562 Pain in left knee: Secondary | ICD-10-CM | POA: Diagnosis not present

## 2021-01-04 ENCOUNTER — Telehealth: Payer: Self-pay | Admitting: Emergency Medicine

## 2021-01-04 NOTE — Telephone Encounter (Signed)
Spoke with the pt  He has appt with RB for nodule consult on 01/07/21  He states had original ct chest done in [place], [place] at [facility] He wants to know if we received this  I checked B pod, up front and also asked RB if he had seen the disc and it is not here  Pt states he signed release to have this mailed sometime last wk  I called [facility] and was transferred to Radiology- was advised the person who mails discs was gone already  Will call them back in the morning and ask for [facility]- [phone]  If we can not get the disc here by appt date it should be rescheduled per RB  Will call number above in the am and then update the pt

## 2021-01-05 NOTE — Telephone Encounter (Signed)
Mark Atkins returning call, can be reached @ [phone].Caren Griffins

## 2021-01-05 NOTE — Telephone Encounter (Signed)
Lmtcb for [employer]. Left detailed message about the disc being mailed to our office.

## 2021-01-05 NOTE — Telephone Encounter (Signed)
pt is returning call,  pt states that he has already signed a release form at [facility] on [address]...pt is willing to sign another one for Dr. Delton Coombes If needed .. call workline & ask for him  please advise [phone]

## 2021-01-05 NOTE — Telephone Encounter (Signed)
Called and spoke with [facility]. She stated that she did receive the disk request from the patient but they are not allowed to send any disks to a doctors office if the the doctor has not requested it. She stated that if we can either the patient to sign a release stating Dr. Delton Coombes has requested the disk or if we can fax a letter on our letterhead requesting the disk, she will be able to overnight the disk to arrive in time for Friday.   Called patient but he did not answer. Left message for patient to call back.

## 2021-01-05 NOTE — Telephone Encounter (Signed)
Spoke with patient. He stated that he had signed a release while at his PCP's office last week. I advised him if that he would grant [place] permission to fax a letter requesting the disk, we can get it sent overnight to the office. He has given me permission to do this. Advised him that I would get RB to sign the release as well.   Called Monique back to let her know. She stated that the fax can be sent to [phone]. Will go ahead and type the letter.   Will keep encounter open as the patient would like to be kept updated.

## 2021-01-06 DIAGNOSIS — S83232A Complex tear of medial meniscus, current injury, left knee, initial encounter: Secondary | ICD-10-CM | POA: Diagnosis not present

## 2021-01-06 DIAGNOSIS — G8918 Other acute postprocedural pain: Secondary | ICD-10-CM | POA: Diagnosis not present

## 2021-01-06 DIAGNOSIS — M2242 Chondromalacia patellae, left knee: Secondary | ICD-10-CM | POA: Diagnosis not present

## 2021-01-06 DIAGNOSIS — Y999 Unspecified external cause status: Secondary | ICD-10-CM | POA: Diagnosis not present

## 2021-01-06 DIAGNOSIS — X58XXXA Exposure to other specified factors, initial encounter: Secondary | ICD-10-CM | POA: Diagnosis not present

## 2021-01-07 ENCOUNTER — Ambulatory Visit (INDEPENDENT_AMBULATORY_CARE_PROVIDER_SITE_OTHER): Payer: BC Managed Care – PPO | Admitting: Emergency Medicine

## 2021-01-07 ENCOUNTER — Encounter: Payer: Self-pay | Admitting: Emergency Medicine

## 2021-01-07 ENCOUNTER — Other Ambulatory Visit: Payer: Self-pay

## 2021-01-07 DIAGNOSIS — R918 Other nonspecific abnormal finding of lung field: Secondary | ICD-10-CM

## 2021-01-07 NOTE — Progress Notes (Signed)
Subjective:    Patient ID: Mark Atkins, male    DOB: Feb 02, 1961, 60 y.o.   MRN: [id number]  HPI 60 year old never smoker with history of osteoarthritis, colon polyps, asthma that was diagnosed in mid 61's.  He is referred today for evaluation of a pulmonary nodule that was found spiritually on CT scan of the chest. He reports that he suffered a fall while in [facility], felt a possible cracked rib.   He is on Singulair during Fall and Spring. Has had about 5 flares over the 25 years.   A CT scan of his chest was done on 05/29/2020 which I have reviewed.  There was an old healed third rib fracture without any new fracture seen.  Also noted was a noncalcified 6 mm right middle lobe pulmonary nodule, ground glass 96mm RUL nodule. There was no axillary, hilar or mediastinal lymphadenopathy.  No abnormalities noted in the abdomen.   Review of Systems As per HPI  Past Medical History:  Diagnosis Date  . Arthritis    left knee  . Asthma    occ  . Colon polyp 2009  . Contact lens/glasses fitting    wears contacts or glasses  . Pneumonia    child  . PONV (postoperative nausea and vomiting)      Family History  Problem Relation Age of Onset  . Hyperlipidemia Mother   . Cancer Father        colon, skin, stomach    No hx sarcoid, HSP in the family No hx lung CA.   Social History   Socioeconomic History  . Marital status: Married    Spouse name: Not on file  . Number of children: Not on file  . Years of education: Not on file  . Highest education level: Not on file  Occupational History  . Not on file  Tobacco Use  . Smoking status: Never Smoker  . Smokeless tobacco: Never Used  Substance and Sexual Activity  . Alcohol use: Yes    Alcohol/week: 7.0 standard drinks    Types: 7 Glasses of wine per week  . Drug use: No  . Sexual activity: Not on file  Other Topics Concern  . Not on file  Social History Narrative  . Not on file   Social Determinants of  Health   Financial Resource Strain: Not on file  Food Insecurity: Not on file  Transportation Needs: Not on file  Physical Activity: Not on file  Stress: Not on file  Social Connections: Not on file  Intimate Partner Violence: Not on file     He is an ophthalmologist Has lived in [place], [place], briefly in [place].  Likes to work in the yard, some fertilizer and bleach exposure.  Some mold exposure - about 3 yrs ago.  No hx positive PPD or TB exposure.    Allergies  Allergen Reactions  . No Known Allergies      Outpatient Medications Prior to Visit  Medication Sig Dispense Refill  . albuterol (VENTOLIN HFA) 108 (90 Base) MCG/ACT inhaler Inhale 1-2 puffs into the lungs every 6 (six) hours as needed for wheezing or shortness of breath.    . Ascorbic Acid (VITAMIN C) 500 MG CAPS See admin instructions.    . clonazePAM (KLONOPIN) 0.5 MG tablet Take 0.25 mg by mouth 2 (two) times daily as needed for anxiety.     . Glucosamine-Chondroitin (OSTEO BI-FLEX REGULAR STRENGTH) 250-200 MG TABS 1 tablet with a meal    .  Misc Natural Products (NF FORMULAS TESTOSTERONE PO) Take 1 tablet by mouth daily. TESTOSTERONE BOOSTER    . montelukast (SINGULAIR) 10 MG tablet 1 tablet    . Multiple Vitamin (MULTIVITAMIN WITH MINERALS) TABS tablet Take 1 tablet by mouth daily.    . Omega-3 Fatty Acids (FISH OIL) 1000 MG CAPS 1 capsule with a meal    . rosuvastatin (CRESTOR) 5 MG tablet 1 tablet    . Turmeric 500 MG CAPS See admin instructions.    . Aspirin-Salicylamide-Caffeine (BC FAST PAIN RELIEF) 650-195-33.3 MG PACK Take 1 packet by mouth 2 (two) times daily as needed (for headache relief).    Marland Kitchen HYDROcodone-acetaminophen (NORCO/VICODIN) 5-325 MG tablet Take 1-2 tablets by mouth every 4 (four) hours as needed for moderate pain or severe pain. 20 tablet 0  . ibuprofen (ADVIL,MOTRIN) 200 MG tablet Take 400 mg by mouth every 8 (eight) hours as needed (for pain).    . naproxen sodium (ANAPROX) 220 MG tablet Take  220 mg by mouth 2 (two) times daily as needed (for pain.).    Marland Kitchen ondansetron (ZOFRAN ODT) 4 MG disintegrating tablet Take 1 tablet (4 mg total) by mouth every 8 (eight) hours as needed for nausea or vomiting. 10 tablet 0   No facility-administered medications prior to visit.         Objective:   Physical Exam  Vitals:   01/07/21 1057  BP: 134/72  Pulse: 62  Temp: 98.3 F (36.8 C)  TempSrc: Temporal  SpO2: 96%  Weight: 180 lb (81.6 kg)  Height: 5\' 9"  (1.753 m)   Gen: Pleasant, well-nourished, in no distress,  normal affect  ENT: No lesions,  mouth clear,  oropharynx clear, no postnasal drip  Neck: No JVD, no stridor  Lungs: No use of accessory muscles, no crackles or wheezing on normal respiration, no wheeze on forced expiration  Cardiovascular: RRR, heart sounds normal, no murmur or gallops, no peripheral edema  Musculoskeletal: No deformities, no cyanosis or clubbing  Neuro: alert, awake, non focal  Skin: Warm, no lesions or rash       Assessment & Plan:  Pulmonary nodules/lesions, multiple Asymptomatic and without clear etiology.  None have concerning characteristics.  His exposure profile and risk profile are low.  He does have asthma which raises the question of possible autoimmune inflammatory process, sarcoid, Churg-Strauss, etc.  Again nothing to support currently.  Most appropriate plan is to follow with serial imaging to ensure stability.  If there is an interval change then we will proceed with further investigation, serologies, probably bronchoscopy and sampling versus primary resection.  We will repeat his scan in 12 months and follow-up to review.   , MD, PhD 01/07/2021, 11:38 AM [facility] [phone] or if no answer before 7:00PM call [phone] For any issues after 7:00PM please call eLink [phone]

## 2021-01-07 NOTE — Patient Instructions (Signed)
We will plan to repeat your CT scan of the chest without contrast to follow your pulmonary nodules in March 2023. Agree with using Singulair during the fall and spring allergy seasons Keep your albuterol available to use if you need it for shortness of breath, wheezing, chest tightness. Please call if have any changes in symptoms, new shortness of breath, chest discomfort, cough. Otherwise follow with Dr. Delton Coombes in March 2023 after your CT scan so that we can review together.

## 2021-01-07 NOTE — Assessment & Plan Note (Signed)
Asymptomatic and without clear etiology.  None have concerning characteristics.  His exposure profile and risk profile are low.  He does have asthma which raises the question of possible autoimmune inflammatory process, sarcoid, Churg-Strauss, etc.  Again nothing to support currently.  Most appropriate plan is to follow with serial imaging to ensure stability.  If there is an interval change then we will proceed with further investigation, serologies, probably bronchoscopy and sampling versus primary resection.  We will repeat his scan in 12 months and follow-up to review.

## 2021-01-07 NOTE — Addendum Note (Signed)
Addended by: Dorisann Frames R on: 01/07/2021 11:49 AM   Modules accepted: Orders

## 2021-01-07 NOTE — Telephone Encounter (Signed)
Spoke with RB. He stated that he was able to get the CT disk in time for the appointment today.   Will close encounter.

## 2021-01-12 DIAGNOSIS — M94262 Chondromalacia, left knee: Secondary | ICD-10-CM | POA: Diagnosis not present

## 2021-01-12 DIAGNOSIS — Z9889 Other specified postprocedural states: Secondary | ICD-10-CM | POA: Diagnosis not present

## 2021-01-12 DIAGNOSIS — S83242A Other tear of medial meniscus, current injury, left knee, initial encounter: Secondary | ICD-10-CM | POA: Diagnosis not present

## 2021-02-16 DIAGNOSIS — S83242A Other tear of medial meniscus, current injury, left knee, initial encounter: Secondary | ICD-10-CM | POA: Diagnosis not present

## 2021-02-16 DIAGNOSIS — M94262 Chondromalacia, left knee: Secondary | ICD-10-CM | POA: Diagnosis not present

## 2021-03-23 DIAGNOSIS — Z125 Encounter for screening for malignant neoplasm of prostate: Secondary | ICD-10-CM | POA: Diagnosis not present

## 2021-03-23 DIAGNOSIS — E7800 Pure hypercholesterolemia, unspecified: Secondary | ICD-10-CM | POA: Diagnosis not present

## 2021-03-23 DIAGNOSIS — Z0000 Encounter for general adult medical examination without abnormal findings: Secondary | ICD-10-CM | POA: Diagnosis not present

## 2021-03-23 DIAGNOSIS — R911 Solitary pulmonary nodule: Secondary | ICD-10-CM | POA: Diagnosis not present

## 2021-03-23 DIAGNOSIS — F418 Other specified anxiety disorders: Secondary | ICD-10-CM | POA: Diagnosis not present

## 2021-04-29 DIAGNOSIS — Z86010 Personal history of colonic polyps: Secondary | ICD-10-CM | POA: Diagnosis not present

## 2021-06-08 DIAGNOSIS — Z800 Family history of malignant neoplasm of digestive organs: Secondary | ICD-10-CM | POA: Diagnosis not present

## 2021-06-08 DIAGNOSIS — K449 Diaphragmatic hernia without obstruction or gangrene: Secondary | ICD-10-CM | POA: Diagnosis not present

## 2021-06-08 DIAGNOSIS — Z1211 Encounter for screening for malignant neoplasm of colon: Secondary | ICD-10-CM | POA: Diagnosis not present

## 2021-06-08 DIAGNOSIS — K2930 Chronic superficial gastritis without bleeding: Secondary | ICD-10-CM | POA: Diagnosis not present

## 2021-06-08 DIAGNOSIS — K3189 Other diseases of stomach and duodenum: Secondary | ICD-10-CM | POA: Diagnosis not present

## 2021-06-29 DIAGNOSIS — D225 Melanocytic nevi of trunk: Secondary | ICD-10-CM | POA: Diagnosis not present

## 2021-06-29 DIAGNOSIS — D2262 Melanocytic nevi of left upper limb, including shoulder: Secondary | ICD-10-CM | POA: Diagnosis not present

## 2021-06-29 DIAGNOSIS — D485 Neoplasm of uncertain behavior of skin: Secondary | ICD-10-CM | POA: Diagnosis not present

## 2021-06-29 DIAGNOSIS — L821 Other seborrheic keratosis: Secondary | ICD-10-CM | POA: Diagnosis not present

## 2021-06-29 DIAGNOSIS — L578 Other skin changes due to chronic exposure to nonionizing radiation: Secondary | ICD-10-CM | POA: Diagnosis not present

## 2021-06-29 DIAGNOSIS — L816 Other disorders of diminished melanin formation: Secondary | ICD-10-CM | POA: Diagnosis not present

## 2021-07-20 DIAGNOSIS — M79672 Pain in left foot: Secondary | ICD-10-CM | POA: Diagnosis not present

## 2021-07-20 DIAGNOSIS — M25572 Pain in left ankle and joints of left foot: Secondary | ICD-10-CM | POA: Diagnosis not present

## 2021-07-20 DIAGNOSIS — M6281 Muscle weakness (generalized): Secondary | ICD-10-CM | POA: Diagnosis not present

## 2021-07-20 DIAGNOSIS — M722 Plantar fascial fibromatosis: Secondary | ICD-10-CM | POA: Diagnosis not present

## 2021-07-27 DIAGNOSIS — M20012 Mallet finger of left finger(s): Secondary | ICD-10-CM | POA: Diagnosis not present

## 2021-07-27 DIAGNOSIS — M6281 Muscle weakness (generalized): Secondary | ICD-10-CM | POA: Diagnosis not present

## 2021-07-27 DIAGNOSIS — M79645 Pain in left finger(s): Secondary | ICD-10-CM | POA: Diagnosis not present

## 2021-07-27 DIAGNOSIS — M79672 Pain in left foot: Secondary | ICD-10-CM | POA: Diagnosis not present

## 2021-07-27 DIAGNOSIS — M79642 Pain in left hand: Secondary | ICD-10-CM | POA: Diagnosis not present

## 2021-07-27 DIAGNOSIS — M25572 Pain in left ankle and joints of left foot: Secondary | ICD-10-CM | POA: Diagnosis not present

## 2021-08-09 DIAGNOSIS — M6281 Muscle weakness (generalized): Secondary | ICD-10-CM | POA: Diagnosis not present

## 2021-08-09 DIAGNOSIS — M25572 Pain in left ankle and joints of left foot: Secondary | ICD-10-CM | POA: Diagnosis not present

## 2021-08-09 DIAGNOSIS — M79672 Pain in left foot: Secondary | ICD-10-CM | POA: Diagnosis not present

## 2021-08-10 DIAGNOSIS — M25572 Pain in left ankle and joints of left foot: Secondary | ICD-10-CM | POA: Diagnosis not present

## 2021-08-10 DIAGNOSIS — M6281 Muscle weakness (generalized): Secondary | ICD-10-CM | POA: Diagnosis not present

## 2021-08-10 DIAGNOSIS — M20012 Mallet finger of left finger(s): Secondary | ICD-10-CM | POA: Diagnosis not present

## 2021-08-10 DIAGNOSIS — M79672 Pain in left foot: Secondary | ICD-10-CM | POA: Diagnosis not present

## 2021-08-16 DIAGNOSIS — U071 COVID-19: Secondary | ICD-10-CM | POA: Diagnosis not present

## 2021-08-16 DIAGNOSIS — J45909 Unspecified asthma, uncomplicated: Secondary | ICD-10-CM | POA: Diagnosis not present

## 2021-09-07 DIAGNOSIS — M20012 Mallet finger of left finger(s): Secondary | ICD-10-CM | POA: Diagnosis not present

## 2021-09-09 ENCOUNTER — Telehealth: Payer: Self-pay | Admitting: Emergency Medicine

## 2021-09-09 NOTE — Telephone Encounter (Signed)
Sorry to hear he is not doing well. He was seen last in 12/2020 for lung nodules. Will need to be seen in office for evaluation or contact PCP or go to urgent care.  Please contact office for sooner follow up if symptoms do not improve or worsen or seek emergency care

## 2021-09-09 NOTE — Telephone Encounter (Signed)
Called and spoke with pt letting him know recs per TP and pt stated he wanted to wait until RB was avail. Have scheduled pt a video visit with RB 1/3. Nothing further needed.

## 2021-09-09 NOTE — Telephone Encounter (Signed)
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   Are we able to send this in for him?    His Preferred [facility] [place] on [address]

## 2021-09-13 ENCOUNTER — Encounter: Payer: Self-pay | Admitting: Emergency Medicine

## 2021-09-13 ENCOUNTER — Telehealth (INDEPENDENT_AMBULATORY_CARE_PROVIDER_SITE_OTHER): Payer: BC Managed Care – PPO | Admitting: Emergency Medicine

## 2021-09-13 ENCOUNTER — Other Ambulatory Visit: Payer: Self-pay

## 2021-09-13 DIAGNOSIS — J4520 Mild intermittent asthma, uncomplicated: Secondary | ICD-10-CM | POA: Insufficient documentation

## 2021-09-13 DIAGNOSIS — R918 Other nonspecific abnormal finding of lung field: Secondary | ICD-10-CM

## 2021-09-13 DIAGNOSIS — J4521 Mild intermittent asthma with (acute) exacerbation: Secondary | ICD-10-CM | POA: Diagnosis not present

## 2021-09-13 MED ORDER — PREDNISONE 10 MG PO TABS: ORAL_TABLET | ORAL | 0 refills | Status: AC

## 2021-09-13 NOTE — Assessment & Plan Note (Signed)
Pulmonary nodules of unclear significance, consider Churg-Strauss or other autoimmune inflammatory process given his history of asthma.  We are planning for a repeat CT scan of his chest in March 2023.

## 2021-09-13 NOTE — Assessment & Plan Note (Addendum)
Flaring symptoms that include cough, chest discomfort in the setting of COVID-19.  His positive test was about 2-3 weeks ago, got pred and paxlovid up front but then rebounded. He still has cough and the chest discomfort.  Consistent with an acute flare.  We will treat him with prednisone.  Continue Flovent 220, albuterol as needed.

## 2021-09-13 NOTE — Progress Notes (Signed)
Virtual Visit via Phone Note  I connected with Mark Atkins on 09/13/21 at 11:15 AM EST by a video enabled telemedicine application and verified that I am speaking with the correct person using two identifiers.  Location: Patient: Home Provider: Office   I discussed the limitations of evaluation and management by telemedicine and the availability of in person appointments. The patient expressed understanding and agreed to proceed.  History of Present Illness: 61 year old gentleman, never smoker with a history of osteoarthritis, asthma.  I saw him in April 2022 for an abnormal CT scan of the chest that showed pulmonary nodules.  The scan was done after he suffered a fall and had a rib fracture.  He has been managed on Singulair during the fall and spring months.  Flares rarely.   Observations/Objective: Patient was diagnosed with COVID-19 about 2 weeks ago, had the flu 8 weeks ago.  His symptoms included fatigue, aches, some SOB. Has had persistent SOB, wheeze. He was treated w paxlovid, but had rebound sx. Also got short course pred.  His flovent was increased to 220 by PCP. Has required increased BD's.    Assessment and Plan: Mild intermittent asthma Flaring symptoms that include cough, chest discomfort in the setting of COVID-19.  His positive test was about 2 weeks ago but he still has cough and the chest discomfort.  Consistent with an acute flare.  We will treat him with prednisone, azithromycin.  Continue Flovent, albuterol as needed.  Pulmonary nodules/lesions, multiple Pulmonary nodules of unclear significance, consider Churg-Strauss or other autoimmune inflammatory process given his history of asthma.  We are planning for a repeat CT scan of his chest in March 2023.   Follow Up Instructions: March 2023 after Ct chest to review together.    I discussed the assessment and treatment plan with the patient. The patient was provided an opportunity to ask questions and all were  answered. The patient agreed with the plan and demonstrated an understanding of the instructions.   The patient was advised to call back or seek an in-person evaluation if the symptoms worsen or if the condition fails to improve as anticipated.  I provided 15 minutes of non-face-to-face time during this encounter.   Leslye Peer, MD

## 2021-09-20 ENCOUNTER — Telehealth: Payer: Self-pay | Admitting: Emergency Medicine

## 2021-09-20 NOTE — Telephone Encounter (Signed)
Call made to patient, confirmed DOB. Made aware of RB recommendations. Voiced understanding. Patient will call back later in week with update.   Nothing further needed at this time.

## 2021-09-20 NOTE — Telephone Encounter (Signed)
Call made to patient, confirmed DOB. Patient recently had a video visit, was given prednisone and he is still not feeling better. Reports he continues to have increased SOB and he has went back to using his nebulizer twice daily and using his albuterol every 3-4 hours. Reports he is still having some voice hoarseness as well. He is requesting further recommendations. He has 2 more days left of prednisone. Reports being covid positive about 2-3 weeks ago. Denies fever, sweats, chills, or body aches.    RB please advise.

## 2021-09-20 NOTE — Telephone Encounter (Signed)
Have him finish the prednisone as planned Have him use OTC congestion relief like tylenol cold and flu Unfortunately not surprising that he still doesn't feel completely better - can take an extended time to recover from this.  Please have him call us again before end of week to see how he is progressing

## 2021-09-23 MED ORDER — DULERA 200-5 MCG/ACT IN AERO: 2.0000 | INHALATION_SPRAY | Freq: Two times a day (BID) | RESPIRATORY_TRACT | 11 refills | Status: AC

## 2021-09-23 NOTE — Telephone Encounter (Signed)
Spoke with patient who states that he is using the Flovent inhaler twice a day, rescue inhaler about 4 times a day and nebulizer 1 or 2 times a day. States that he is wondering if he would benefit from Advair 250. States he is still not feeling completely better.   Dr. Delton Coombes is listed off till 10/03/21 [facility] please advise

## 2021-09-23 NOTE — Addendum Note (Signed)
Addended by: Katrinka Blazing R on: 09/23/2021 04:11 PM   Modules accepted: Orders

## 2021-09-23 NOTE — Telephone Encounter (Signed)
Is he taking the flovent??  I tried calling the pt and he did not answer and the VM was full  Will have to call back

## 2021-09-23 NOTE — Telephone Encounter (Signed)
ATC patient to discuss message from [place], [place]

## 2021-09-23 NOTE — Telephone Encounter (Signed)
Spoke with patient and we have sent in RX for Dulera 200. Advised him to let us know if he needs anything else. Nothing further needed at this time.

## 2021-09-23 NOTE — Telephone Encounter (Signed)
Yes, can we see if Symbicort 80 or 160, Dulera 100 or 200 or Advair 250 are covered. If so we can send in low-medium dose ICS/LABA inhaler since he is currently on ICS and needing to use SABA frequently

## 2021-11-11 ENCOUNTER — Other Ambulatory Visit: Payer: BC Managed Care – PPO

## 2021-11-30 ENCOUNTER — Ambulatory Visit
Admission: RE | Admit: 2021-11-30 | Discharge: 2021-11-30 | Disposition: A | Discharge status: Home / Self Care | Payer: BC Managed Care – PPO | Source: Ambulatory Visit | Attending: Emergency Medicine | Admitting: Emergency Medicine

## 2021-11-30 DIAGNOSIS — I700 Atherosclerosis of aorta: Secondary | ICD-10-CM | POA: Diagnosis not present

## 2021-11-30 DIAGNOSIS — R918 Other nonspecific abnormal finding of lung field: Secondary | ICD-10-CM | POA: Diagnosis not present

## 2021-12-06 ENCOUNTER — Telehealth: Payer: Self-pay | Admitting: Emergency Medicine

## 2021-12-06 NOTE — Telephone Encounter (Signed)
Please let him know that his nodules are stable in number, size and appearance. Consistent w benign cause. Good news. Thanks.  ?

## 2021-12-06 NOTE — Telephone Encounter (Signed)
Patient had CT scan on 11/30/2021 and has not been given his results. Patient states he is hard to get in touch with so a detailed left voicemail is okay. Call back number [phone] ?

## 2021-12-06 NOTE — Telephone Encounter (Signed)
Patient is aware of results and voiced his understanding.  Nothing further needed.  

## 2021-12-06 NOTE — Telephone Encounter (Signed)
Patient requesting CT chest results from 11/30/21.  Patient stated it is ok to leave CT results on VM, if needed. ? ?Message routed to Dr. Delton Coombes to advise on CT results ?

## 2022-01-04 DIAGNOSIS — D2262 Melanocytic nevi of left upper limb, including shoulder: Secondary | ICD-10-CM | POA: Diagnosis not present

## 2022-01-04 DIAGNOSIS — L578 Other skin changes due to chronic exposure to nonionizing radiation: Secondary | ICD-10-CM | POA: Diagnosis not present

## 2022-01-04 DIAGNOSIS — L989 Disorder of the skin and subcutaneous tissue, unspecified: Secondary | ICD-10-CM | POA: Diagnosis not present

## 2022-01-04 DIAGNOSIS — D225 Melanocytic nevi of trunk: Secondary | ICD-10-CM | POA: Diagnosis not present

## 2022-01-04 DIAGNOSIS — D485 Neoplasm of uncertain behavior of skin: Secondary | ICD-10-CM | POA: Diagnosis not present

## 2022-01-04 DIAGNOSIS — L821 Other seborrheic keratosis: Secondary | ICD-10-CM | POA: Diagnosis not present

## 2022-04-05 DIAGNOSIS — D485 Neoplasm of uncertain behavior of skin: Secondary | ICD-10-CM | POA: Diagnosis not present

## 2022-04-05 DIAGNOSIS — L988 Other specified disorders of the skin and subcutaneous tissue: Secondary | ICD-10-CM | POA: Diagnosis not present

## 2022-04-08 DIAGNOSIS — S76112A Strain of left quadriceps muscle, fascia and tendon, initial encounter: Secondary | ICD-10-CM | POA: Diagnosis not present

## 2022-04-17 DIAGNOSIS — H02832 Dermatochalasis of right lower eyelid: Secondary | ICD-10-CM | POA: Diagnosis not present

## 2022-04-17 DIAGNOSIS — H02834 Dermatochalasis of left upper eyelid: Secondary | ICD-10-CM | POA: Diagnosis not present

## 2022-04-17 DIAGNOSIS — H02835 Dermatochalasis of left lower eyelid: Secondary | ICD-10-CM | POA: Diagnosis not present

## 2022-04-17 DIAGNOSIS — H02831 Dermatochalasis of right upper eyelid: Secondary | ICD-10-CM | POA: Diagnosis not present

## 2022-04-17 DIAGNOSIS — H0279 Other degenerative disorders of eyelid and periocular area: Secondary | ICD-10-CM | POA: Diagnosis not present

## 2022-04-26 DIAGNOSIS — M1712 Unilateral primary osteoarthritis, left knee: Secondary | ICD-10-CM | POA: Diagnosis not present

## 2022-05-24 DIAGNOSIS — Z23 Encounter for immunization: Secondary | ICD-10-CM | POA: Diagnosis not present

## 2022-05-24 DIAGNOSIS — E7800 Pure hypercholesterolemia, unspecified: Secondary | ICD-10-CM | POA: Diagnosis not present

## 2022-05-24 DIAGNOSIS — M25562 Pain in left knee: Secondary | ICD-10-CM | POA: Diagnosis not present

## 2022-05-24 DIAGNOSIS — Z0000 Encounter for general adult medical examination without abnormal findings: Secondary | ICD-10-CM | POA: Diagnosis not present

## 2022-05-26 ENCOUNTER — Other Ambulatory Visit: Payer: Self-pay | Admitting: Orthopedic Surgery

## 2022-05-26 DIAGNOSIS — M25562 Pain in left knee: Secondary | ICD-10-CM

## 2022-05-28 ENCOUNTER — Ambulatory Visit
Admission: RE | Admit: 2022-05-28 | Discharge: 2022-05-28 | Disposition: A | Discharge status: Home / Self Care | Payer: BC Managed Care – PPO | Source: Ambulatory Visit | Attending: Orthopedic Surgery | Admitting: Orthopedic Surgery

## 2022-05-28 DIAGNOSIS — M1712 Unilateral primary osteoarthritis, left knee: Secondary | ICD-10-CM | POA: Diagnosis not present

## 2022-05-28 DIAGNOSIS — M25562 Pain in left knee: Secondary | ICD-10-CM

## 2022-05-30 DIAGNOSIS — H02413 Mechanical ptosis of bilateral eyelids: Secondary | ICD-10-CM | POA: Diagnosis not present

## 2022-05-31 DIAGNOSIS — M25562 Pain in left knee: Secondary | ICD-10-CM | POA: Diagnosis not present

## 2022-06-29 DIAGNOSIS — S83232A Complex tear of medial meniscus, current injury, left knee, initial encounter: Secondary | ICD-10-CM | POA: Diagnosis not present

## 2022-06-29 DIAGNOSIS — Y999 Unspecified external cause status: Secondary | ICD-10-CM | POA: Diagnosis not present

## 2022-06-29 DIAGNOSIS — X58XXXA Exposure to other specified factors, initial encounter: Secondary | ICD-10-CM | POA: Diagnosis not present

## 2022-06-29 DIAGNOSIS — M170 Bilateral primary osteoarthritis of knee: Secondary | ICD-10-CM | POA: Diagnosis not present

## 2022-07-05 DIAGNOSIS — Z9889 Other specified postprocedural states: Secondary | ICD-10-CM | POA: Diagnosis not present

## 2022-07-05 DIAGNOSIS — M25562 Pain in left knee: Secondary | ICD-10-CM | POA: Diagnosis not present

## 2022-07-05 DIAGNOSIS — S83207D Unspecified tear of unspecified meniscus, current injury, left knee, subsequent encounter: Secondary | ICD-10-CM | POA: Diagnosis not present

## 2022-07-19 DIAGNOSIS — S83207D Unspecified tear of unspecified meniscus, current injury, left knee, subsequent encounter: Secondary | ICD-10-CM | POA: Diagnosis not present

## 2022-07-19 DIAGNOSIS — M25562 Pain in left knee: Secondary | ICD-10-CM | POA: Diagnosis not present

## 2022-07-19 DIAGNOSIS — Z125 Encounter for screening for malignant neoplasm of prostate: Secondary | ICD-10-CM | POA: Diagnosis not present

## 2022-07-19 DIAGNOSIS — Z0000 Encounter for general adult medical examination without abnormal findings: Secondary | ICD-10-CM | POA: Diagnosis not present

## 2022-07-26 DIAGNOSIS — M5412 Radiculopathy, cervical region: Secondary | ICD-10-CM | POA: Diagnosis not present

## 2022-07-26 DIAGNOSIS — M542 Cervicalgia: Secondary | ICD-10-CM | POA: Diagnosis not present

## 2022-07-26 DIAGNOSIS — M7918 Myalgia, other site: Secondary | ICD-10-CM | POA: Diagnosis not present

## 2022-08-23 DIAGNOSIS — M1712 Unilateral primary osteoarthritis, left knee: Secondary | ICD-10-CM | POA: Diagnosis not present

## 2022-08-30 DIAGNOSIS — M1712 Unilateral primary osteoarthritis, left knee: Secondary | ICD-10-CM | POA: Diagnosis not present

## 2022-09-06 DIAGNOSIS — M1712 Unilateral primary osteoarthritis, left knee: Secondary | ICD-10-CM | POA: Diagnosis not present

## 2022-10-04 DIAGNOSIS — U071 COVID-19: Secondary | ICD-10-CM | POA: Diagnosis not present

## 2022-10-04 DIAGNOSIS — J4521 Mild intermittent asthma with (acute) exacerbation: Secondary | ICD-10-CM | POA: Diagnosis not present

## 2022-10-11 DIAGNOSIS — M1712 Unilateral primary osteoarthritis, left knee: Secondary | ICD-10-CM | POA: Diagnosis not present

## 2022-11-01 DIAGNOSIS — M1712 Unilateral primary osteoarthritis, left knee: Secondary | ICD-10-CM | POA: Diagnosis not present

## 2022-11-18 IMAGING — MR MR KNEE*L* W/O CM
4 of 6 series · 23 of 40 positions shown · non-contrast
Comparison: Plain films left knee 04/19/2011.

CLINICAL DATA: Anterior left knee pain for 2 months in a runner. No
known injury.

EXAM:
MRI OF THE LEFT KNEE WITHOUT CONTRAST
TECHNIQUE: Multiplanar, multisequence MR imaging of the knee was performed. No
intravenous contrast was administered.

[Series 3: T2 fat-sat · axial · 4.0mm · 0.50mm/px · z∈[-76,+49]mm · 7 of 31 slices shown (1 of 2)]
[im 1/31]
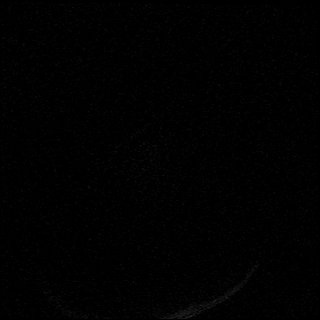
[im 5/31]
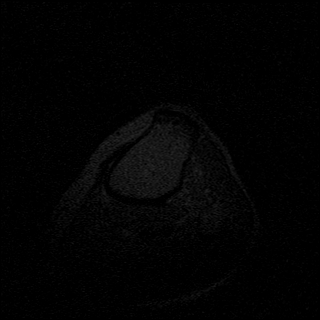
[im 9/31]
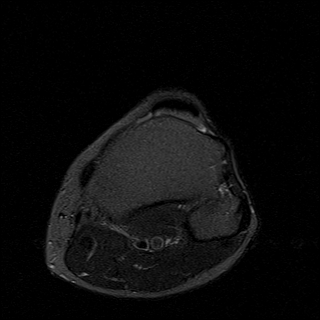
[im 13/31]
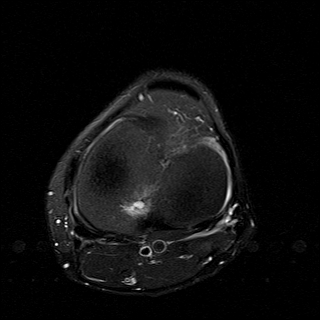
[im 18/31]
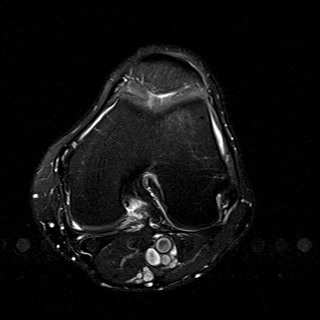
[im 22/31]
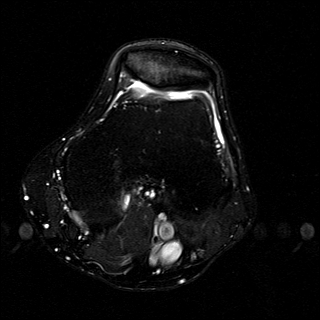
[im 26/31]
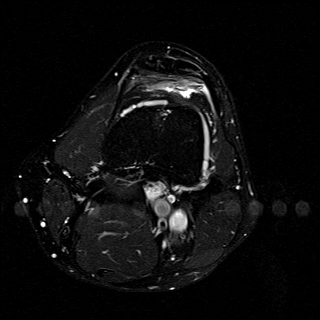

[Series 5: T2 fat-sat · coronal · 4.0mm · 0.29mm/px · 3 of 27 slices shown (2 of 2)]
[im 6/27]
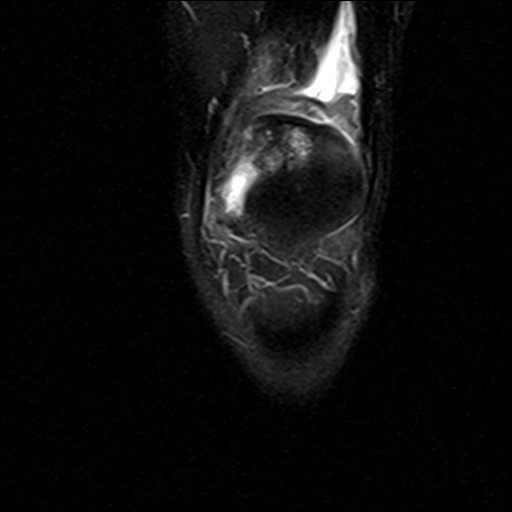
[im 16/27]
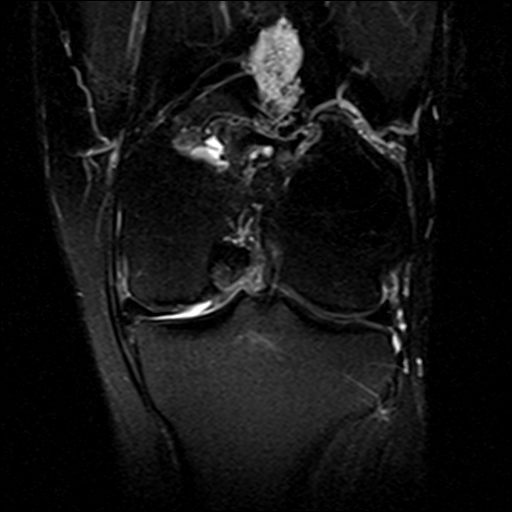
[im 27/27]
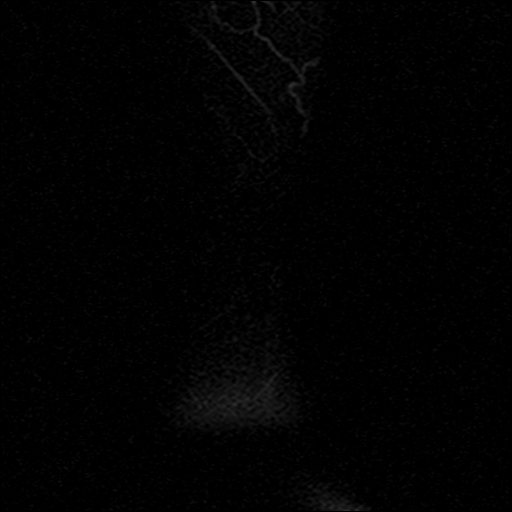

[Series 7: PD fat-sat · sagittal · 3.0mm · 0.29mm/px · 7 of 29 slices shown (1 of 2)]
[im 1/29]
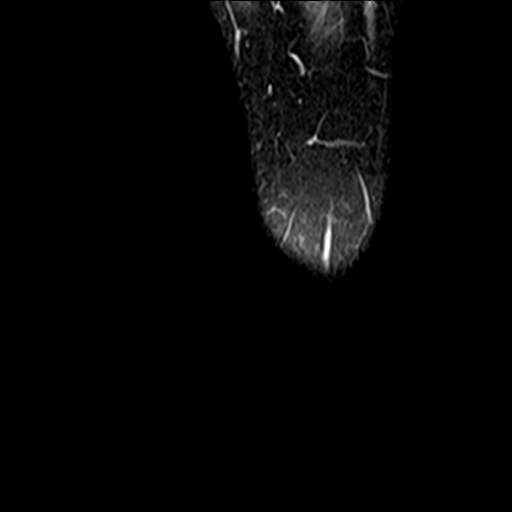
[im 5/29]
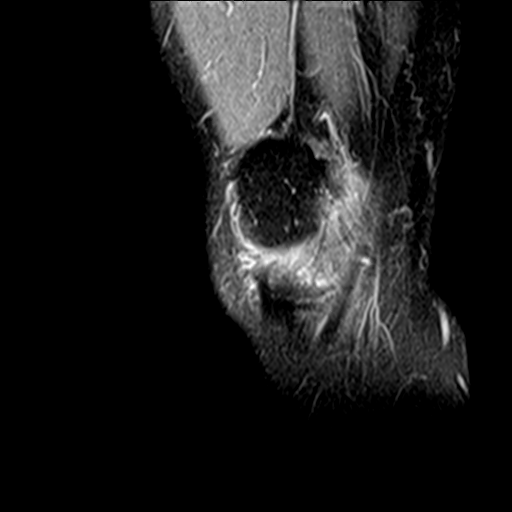
[im 10/29]
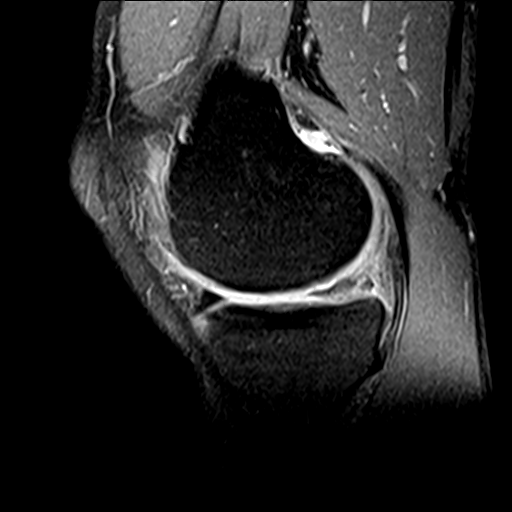
[im 15/29]
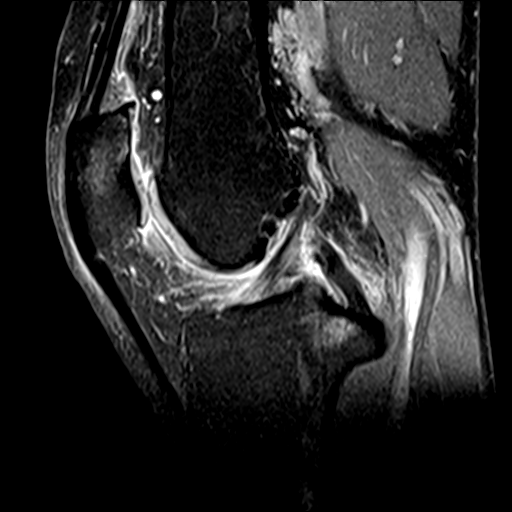
[im 19/29]
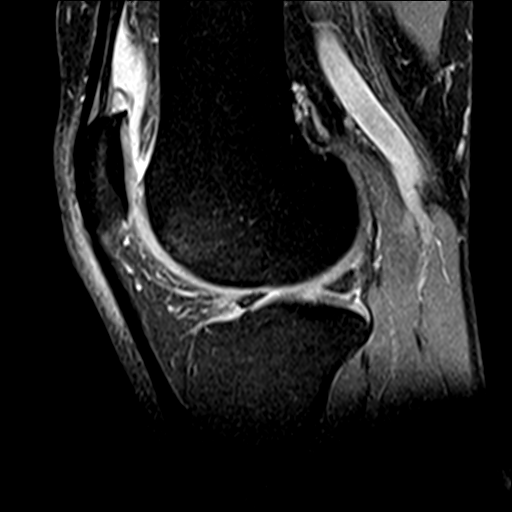
[im 24/29]
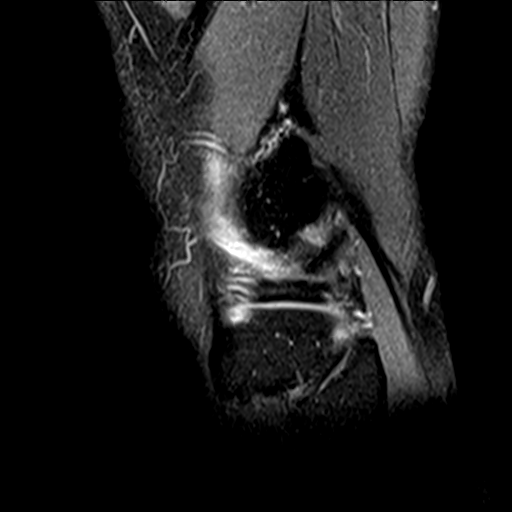
[im 29/29]
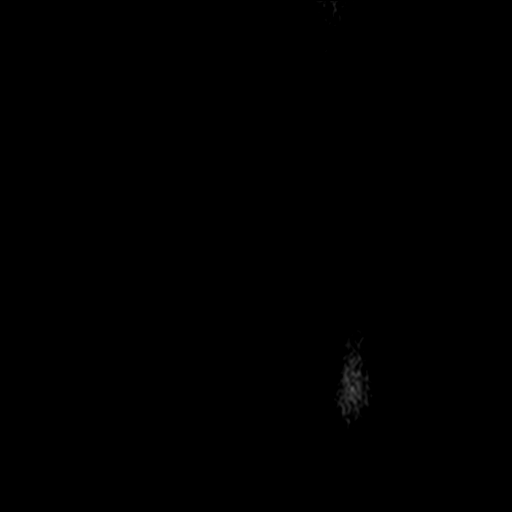

[Series 8: PD fat-sat · coronal · 3.0mm · 0.29mm/px · 6 of 25 slices shown (2 of 2)]
[im 1/25]
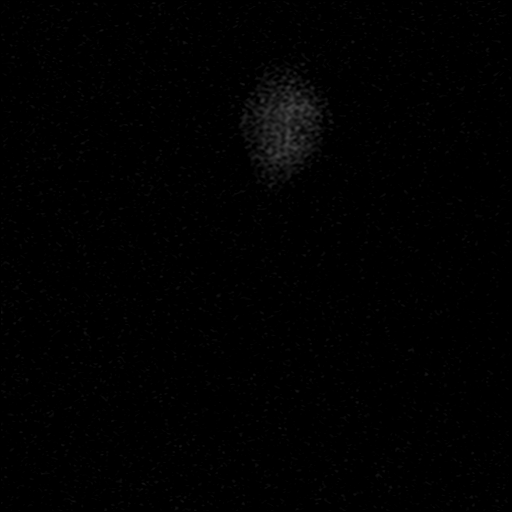
[im 5/25]
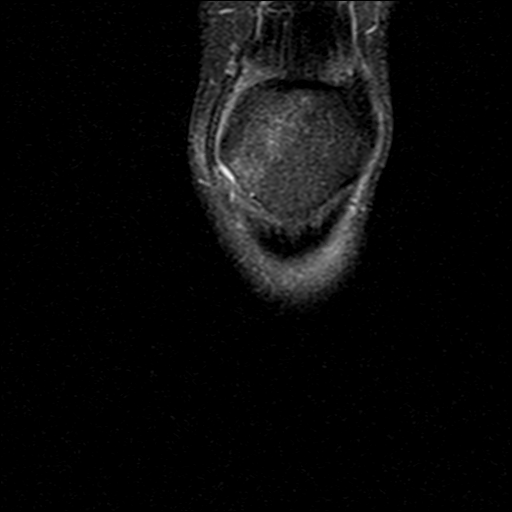
[im 10/25]
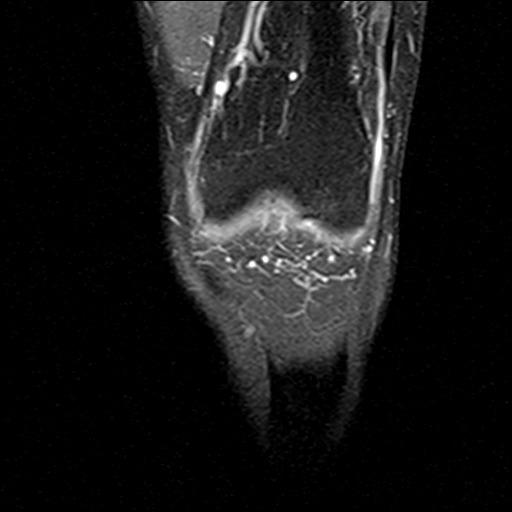
[im 15/25]
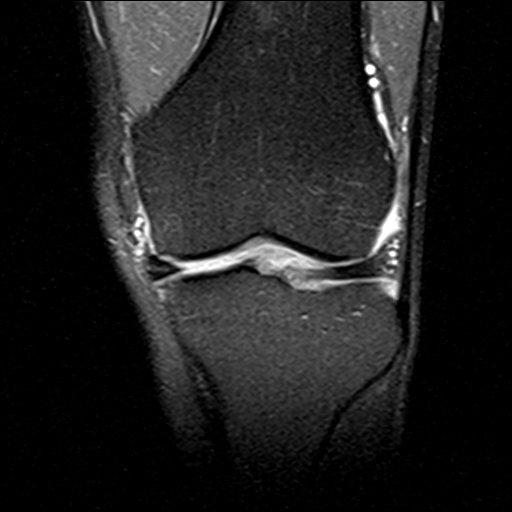
[im 20/25]
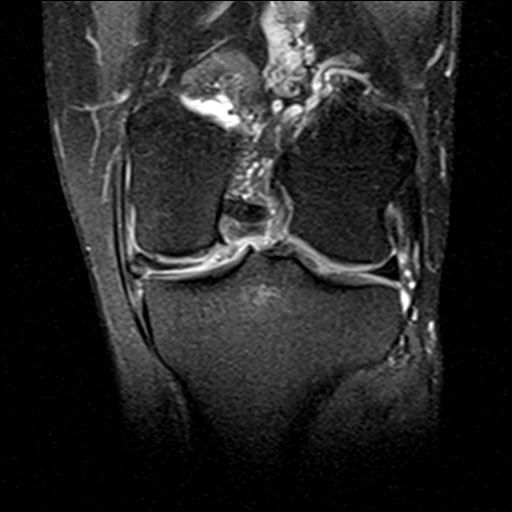
[im 25/25]
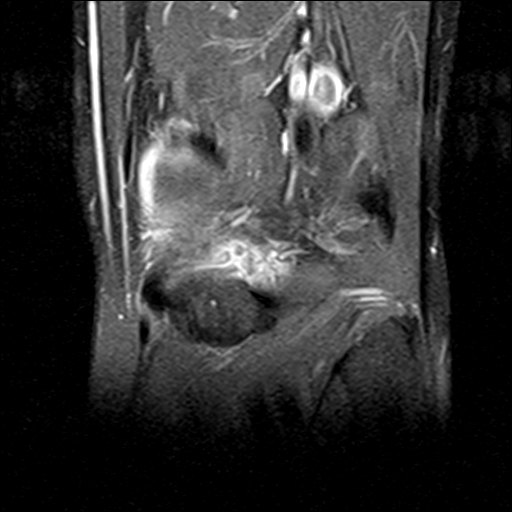

[23 of 40 positions shown; findings below may reference images not displayed]

FINDINGS: MENISCI

Medial meniscus: There is complex tearing at and just peripheral to
the root of the posterior horn. More peripherally in the posterior
horn, a horizontal tear reaches the meniscal undersurface and
extends to the midbody.

Lateral meniscus:  Intact.

LIGAMENTS

Cruciates:  Intact.

Collaterals:  Intact.

CARTILAGE

Patellofemoral: Cartilage at the patellar apex in the superior pole
is completely denuded. Fairly marked cartilage thinning is also seen
along the periphery of the medial facet.

Medial:  Frayed and irregular without focal defect.

Lateral:  Minimally degenerated.

Joint:  Small joint effusion.

Popliteal Fossa:  No Baker's cyst.

Extensor Mechanism:  Intact.

Bones: No fracture or worrisome lesion. Subchondral edema is seen in
the medial patellar facet and apex and the patella in the superior
pole. There is also a small focus of marrow edema subjacent to the
root of the posterior horn of the medial meniscus. Small osteophyte
off the superior pole the patella noted.

Other: None.
IMPRESSION: Complex tearing at and just peripheral to the root of the posterior
horn of the medial meniscus. A horizontal tear in the remainder of
the posterior horn extends peripherally to the midbody of the medial
meniscus.

Moderate to moderately severe osteoarthritis appears worst in the
patellofemoral compartment.

## 2022-11-18 IMAGING — CT CT CHEST W/O CM
1 series · 14 of 34 positions shown, 18 images · non-contrast
Comparison: Lung bases from abdominopelvic CT 08/31/2015. Chest
radiograph 03/04/2003. No prior chest CT.

CLINICAL DATA: Follow-up pulmonary nodules.

EXAM:
CT CHEST WITHOUT CONTRAST
TECHNIQUE: Multidetector CT imaging of the chest was performed following the
standard protocol without IV contrast.

[Series 2: chest w/(date) · axial · 0.79mm/px · z∈[-346,-42]mm · 14 of 180 slices shown, 18 images]
[im 14/180  mediastinal]
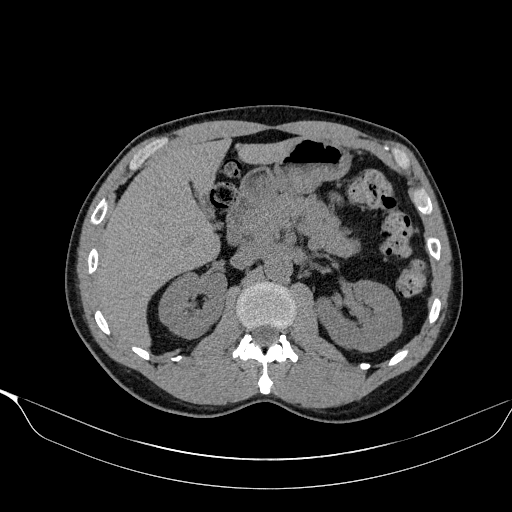
[im 14/180  lung]
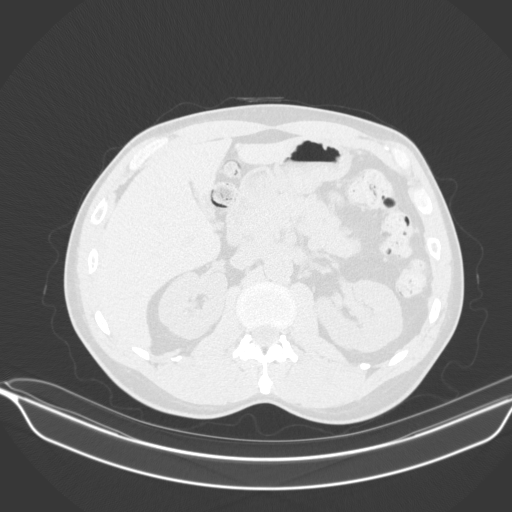
[im 27/180  lung]
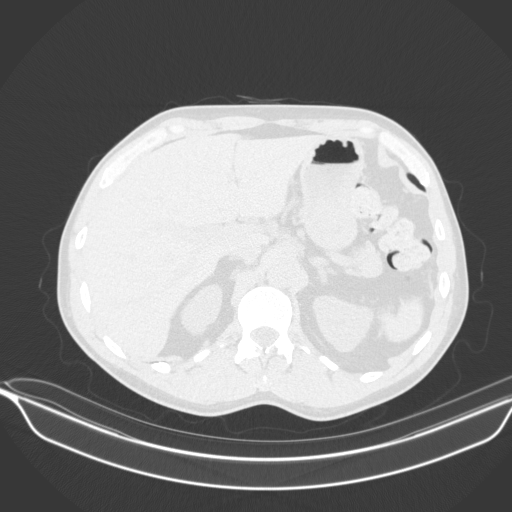
[im 36/180  lung]
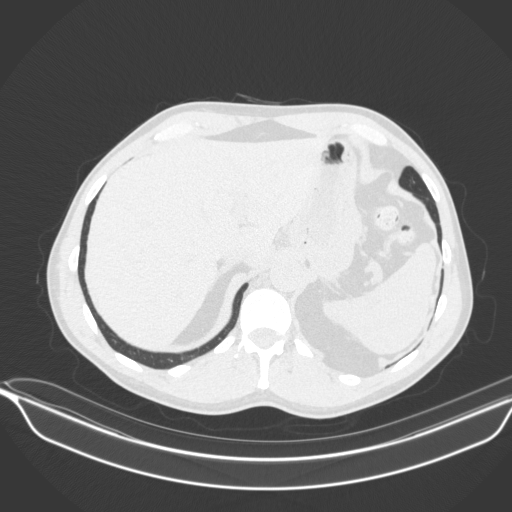
[im 54/180  lung]
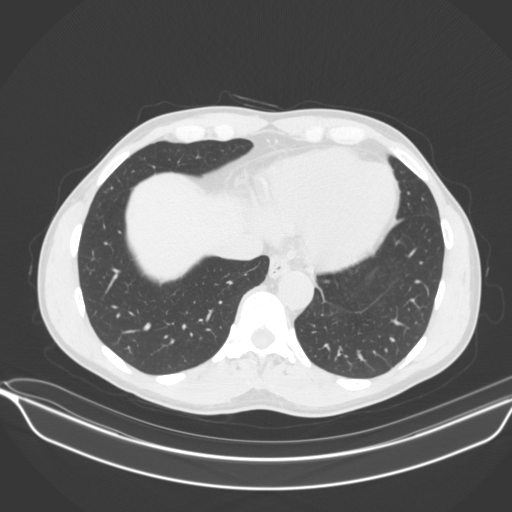
[im 67/180  mediastinal]
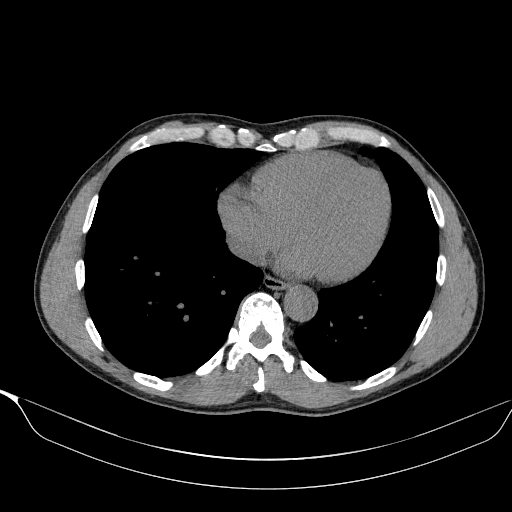
[im 67/180  lung]
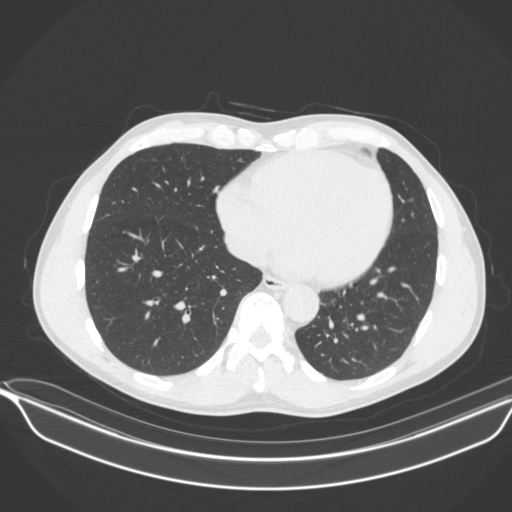
[im 73/180  lung]
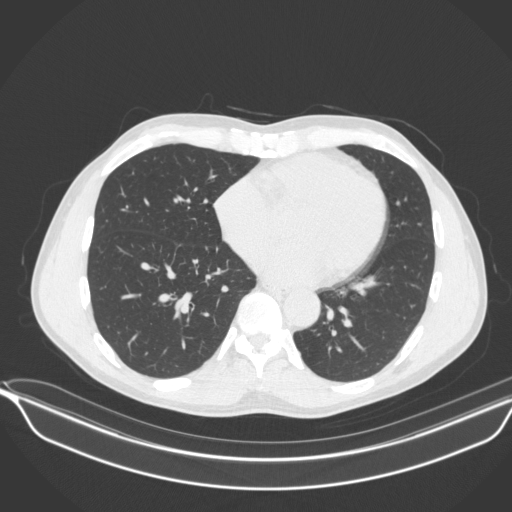
[im 86/180  lung]
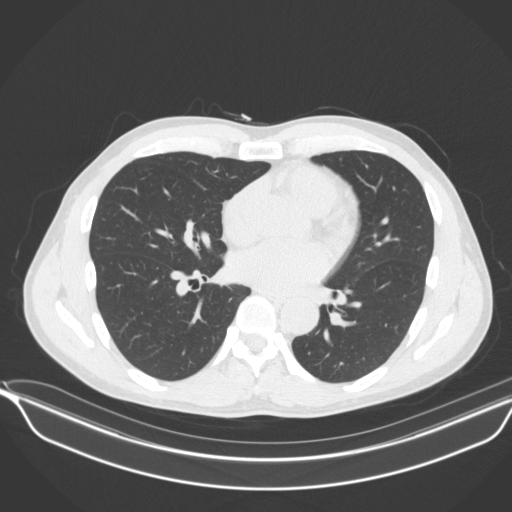
[im 95/180  lung]
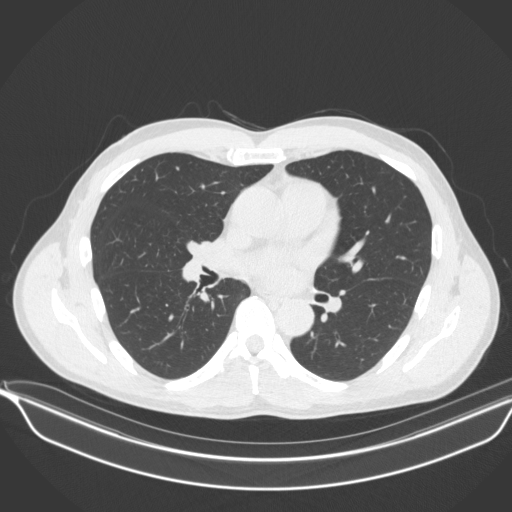
[im 107/180  mediastinal]
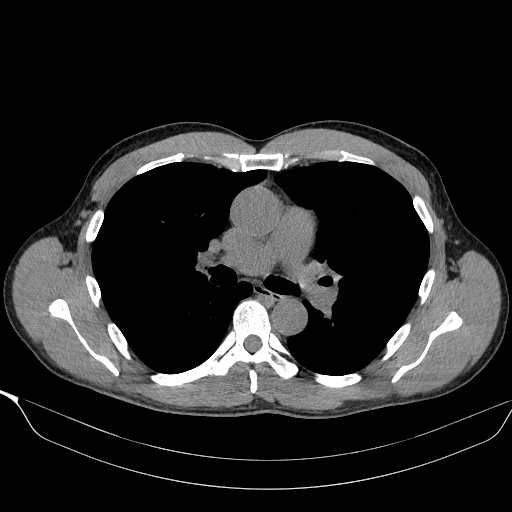
[im 107/180  lung]
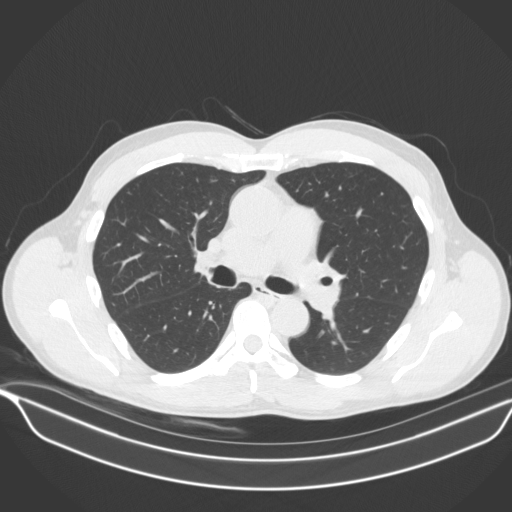
[im 113/180  lung]
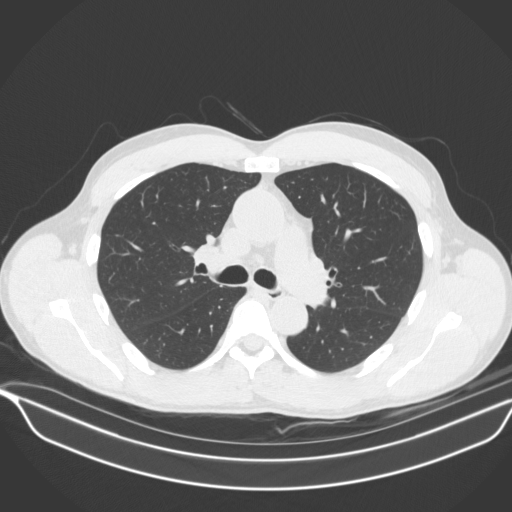
[im 133/180  lung]
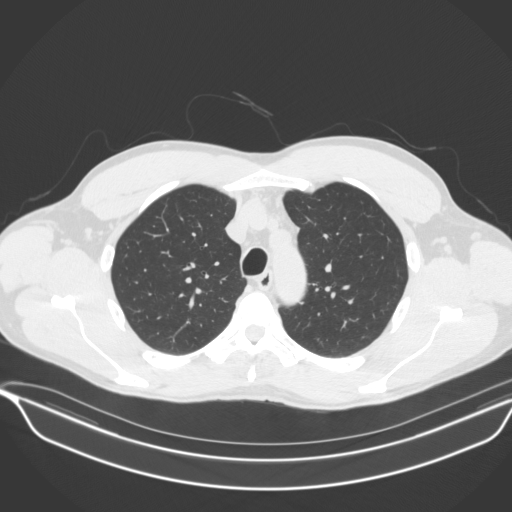
[im 144/180  lung]
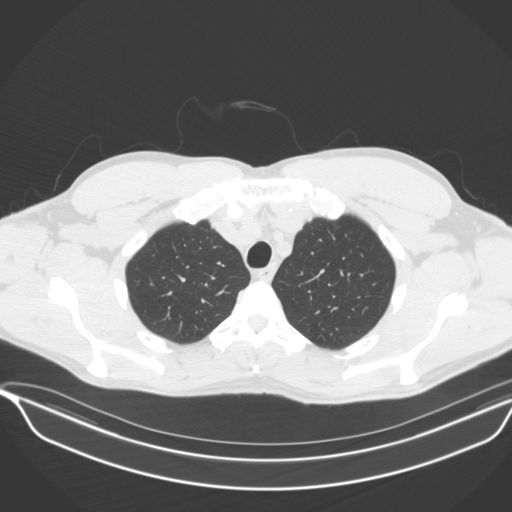
[im 153/180  mediastinal]
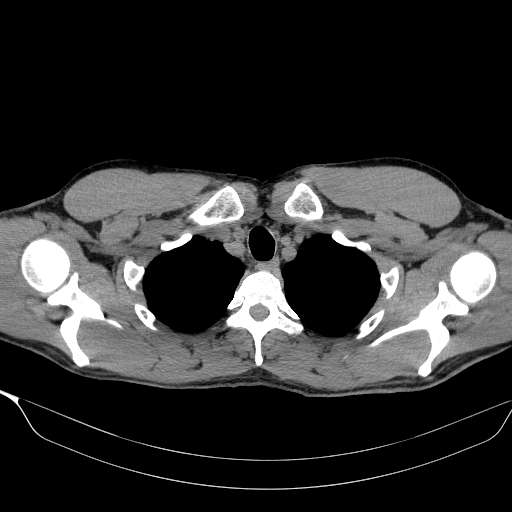
[im 153/180  lung]
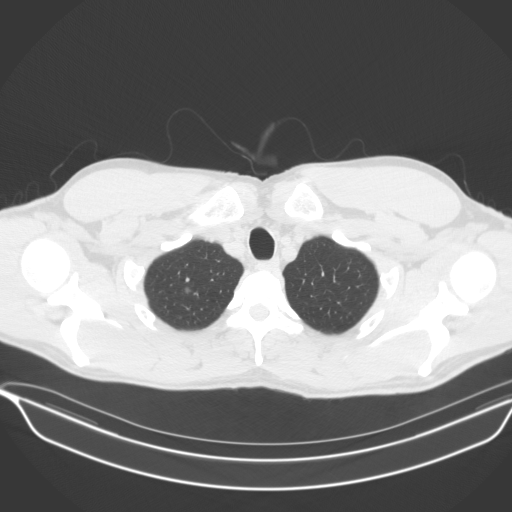
[im 166/180  lung]
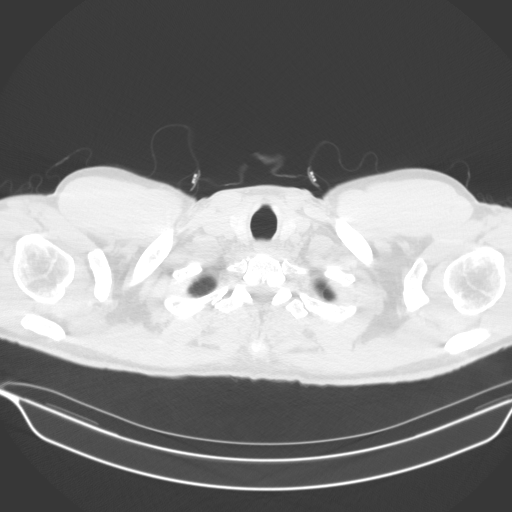

[14 of 34 positions shown; findings below may reference images not displayed]

FINDINGS: Cardiovascular: Mild aortic atherosclerosis. No aortic aneurysm.
Heart is normal in size. No pericardial effusion.

Mediastinum/Nodes: Scattered small mediastinal lymph nodes not
enlarged by size criteria. Limited hilar assessment on noncontrast
exam. No thyroid nodule. Tiny hiatal hernia.

Lungs/Pleura: There are scattered small bilateral pulmonary nodules.
The largest nodule is subpleural in the right middle lobe measuring
6 mm, series 5, image 88, abutting the minor fissure.

Additional nodules as follows: Punctate left upper lobe subpleural
series 5, image 29. Punctate perifissural in the inferior right
upper lobe series 5, image 85. 4 mm left lower lobe series 5, image
109. Tiny subpleural right middle lobe series 5, image 116.

There is also a 6 mm ground-glass nodule at the right lung apex,
series 5, image 28.

Minor left basilar atelectasis or scarring. No confluent
consolidation. No pleural fluid. No findings of pulmonary edema.
Trachea and central bronchi are patent.

Upper Abdomen: No acute or unexpected finding.

Musculoskeletal: There are no acute or suspicious osseous
abnormalities. No chest wall soft tissue abnormality.
IMPRESSION: 1. Scattered small solid bilateral pulmonary nodules, largest
measuring 6 mm in the right middle lobe. There is also a 6 mm
ground-glass nodule at the right lung apex. There are no prior
relevant exams for comparison to assess for stability. If prior
exams become available, it will be reviewed and an addendum can be
issued. In the absence of priors, recommendations as follows:
Non-contrast chest CT at 3-6 months is recommended. If the nodules
are stable at time of repeat CT, then future CT at 18-24 months
(from today's scan) is considered optional for low-risk patients,
but is recommended for high-risk patients. This recommendation
follows the consensus statement: Guidelines for Management of
Incidental Pulmonary Nodules Detected on CT Images: From the
2. Mild aortic atherosclerosis.
3. Tiny hiatal hernia.

Aortic Atherosclerosis ([id number]-7G0.0).

## 2022-11-29 DIAGNOSIS — J4520 Mild intermittent asthma, uncomplicated: Secondary | ICD-10-CM | POA: Diagnosis not present

## 2022-11-29 DIAGNOSIS — F411 Generalized anxiety disorder: Secondary | ICD-10-CM | POA: Diagnosis not present

## 2022-11-29 DIAGNOSIS — E7800 Pure hypercholesterolemia, unspecified: Secondary | ICD-10-CM | POA: Diagnosis not present

## 2022-11-29 DIAGNOSIS — I700 Atherosclerosis of aorta: Secondary | ICD-10-CM | POA: Diagnosis not present

## 2023-06-20 DIAGNOSIS — L821 Other seborrheic keratosis: Secondary | ICD-10-CM | POA: Diagnosis not present

## 2023-06-20 DIAGNOSIS — D2239 Melanocytic nevi of other parts of face: Secondary | ICD-10-CM | POA: Diagnosis not present

## 2023-06-20 DIAGNOSIS — L905 Scar conditions and fibrosis of skin: Secondary | ICD-10-CM | POA: Diagnosis not present

## 2023-06-20 DIAGNOSIS — D2261 Melanocytic nevi of right upper limb, including shoulder: Secondary | ICD-10-CM | POA: Diagnosis not present

## 2023-06-20 DIAGNOSIS — D225 Melanocytic nevi of trunk: Secondary | ICD-10-CM | POA: Diagnosis not present

## 2023-06-20 DIAGNOSIS — L578 Other skin changes due to chronic exposure to nonionizing radiation: Secondary | ICD-10-CM | POA: Diagnosis not present

## 2023-06-20 DIAGNOSIS — D485 Neoplasm of uncertain behavior of skin: Secondary | ICD-10-CM | POA: Diagnosis not present

## 2023-07-04 DIAGNOSIS — Z131 Encounter for screening for diabetes mellitus: Secondary | ICD-10-CM | POA: Diagnosis not present

## 2023-07-04 DIAGNOSIS — J4520 Mild intermittent asthma, uncomplicated: Secondary | ICD-10-CM | POA: Diagnosis not present

## 2023-07-04 DIAGNOSIS — Z130 Encounter for screening for diseases of the blood and blood-forming organs and certain disorders involving the immune mechanism: Secondary | ICD-10-CM | POA: Diagnosis not present

## 2023-07-04 DIAGNOSIS — Z0000 Encounter for general adult medical examination without abnormal findings: Secondary | ICD-10-CM | POA: Diagnosis not present

## 2023-07-04 DIAGNOSIS — Z79899 Other long term (current) drug therapy: Secondary | ICD-10-CM | POA: Diagnosis not present

## 2023-07-04 DIAGNOSIS — F411 Generalized anxiety disorder: Secondary | ICD-10-CM | POA: Diagnosis not present

## 2023-07-04 DIAGNOSIS — E7800 Pure hypercholesterolemia, unspecified: Secondary | ICD-10-CM | POA: Diagnosis not present

## 2023-07-04 DIAGNOSIS — Z125 Encounter for screening for malignant neoplasm of prostate: Secondary | ICD-10-CM | POA: Diagnosis not present

## 2023-10-03 ENCOUNTER — Telehealth: Payer: Self-pay | Admitting: Emergency Medicine

## 2023-10-03 DIAGNOSIS — R918 Other nonspecific abnormal finding of lung field: Secondary | ICD-10-CM

## 2023-10-03 NOTE — Telephone Encounter (Signed)
PT calling because he feels like he needs a yearly CT scan and it has been while. I adv since we have not seen him an a year that he should see Dr.Byrum first with an appt. He said he felt like Dr. Delton Coombes just ordered a CT yearly for him to monitor his nodules. Please call to advise. (He said he spoke to someone last Friday but they did not call him back. He did not recall who and I see no other encounter) His # is [phone]

## 2023-10-08 NOTE — Telephone Encounter (Signed)
ATC patient x1.  Left detailed message per DPR letting him know that we have not seen him since 09/13/2021 and that was a video visit.  I advised that I would send a message to Dr. Delton Coombes to get his recommendations regarding the f/u CT scan and OV.  Once we hear back from him we will call him back with the recommendations.  Dr. Delton Coombes, Mark Atkins is requesting a f/u CT scan, it looks like we have not seen him since 09/13/2021 and that was a video visit and his last CT was in 2023 as well.  Please advise if you would like for him to have a CT scan prior to an OV or have him seen in the OV to discuss a f/u CT scan.  Thank you.

## 2023-10-09 NOTE — Telephone Encounter (Signed)
ATC X1. LMTCB.     Ct has already been ordered. The front office needs to schedule a f/u with Byrum around 2 weeks after the CT is completed.

## 2023-10-09 NOTE — Telephone Encounter (Signed)
LMTCB

## 2023-10-09 NOTE — Telephone Encounter (Signed)
CT chest.  Please arrange for him to have a follow-up visit with me to review that study.  Thank you

## 2023-10-11 ENCOUNTER — Encounter: Payer: Self-pay | Admitting: Emergency Medicine

## 2023-10-11 NOTE — Telephone Encounter (Signed)
Will send letter. MYCHART inop.

## 2023-10-12 NOTE — Telephone Encounter (Signed)
Patient has not returned attempts to contact. Closing encounter per office protocol.

## 2023-11-02 ENCOUNTER — Other Ambulatory Visit: Payer: BC Managed Care – PPO

## 2023-11-07 ENCOUNTER — Ambulatory Visit
Admission: RE | Admit: 2023-11-07 | Discharge: 2023-11-07 | Disposition: A | Discharge status: Home / Self Care | Payer: BC Managed Care – PPO | Source: Ambulatory Visit | Attending: Emergency Medicine | Admitting: Emergency Medicine

## 2023-11-07 DIAGNOSIS — I700 Atherosclerosis of aorta: Secondary | ICD-10-CM | POA: Diagnosis not present

## 2023-11-07 DIAGNOSIS — R918 Other nonspecific abnormal finding of lung field: Secondary | ICD-10-CM | POA: Diagnosis not present

## 2023-11-10 IMAGING — CT CT CHEST W/O CM
2 of 5 series · 15 of 36 positions shown, 18 images · non-contrast
Comparison: Chest CT 12/08/2020.

CLINICAL DATA: 61-year-old male with history of pulmonary nodules.
Follow-up study.



[Series 4: chest 2.00 br40 s3 · coronal · 0.66mm/px · 3 of 133 slices shown]
[im 27/133  lung]
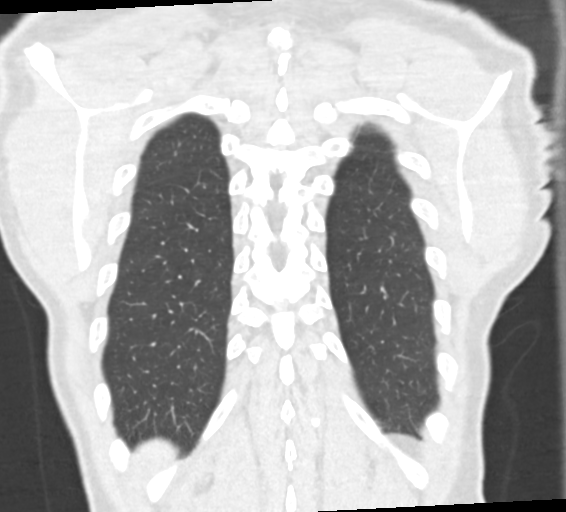
[im 53/133  lung]
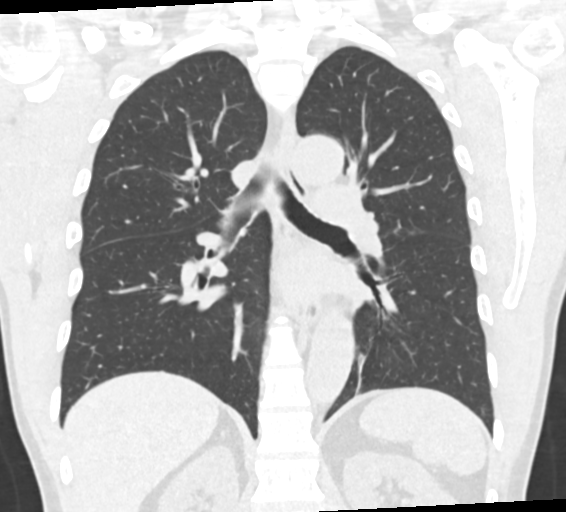
[im 80/133  lung]
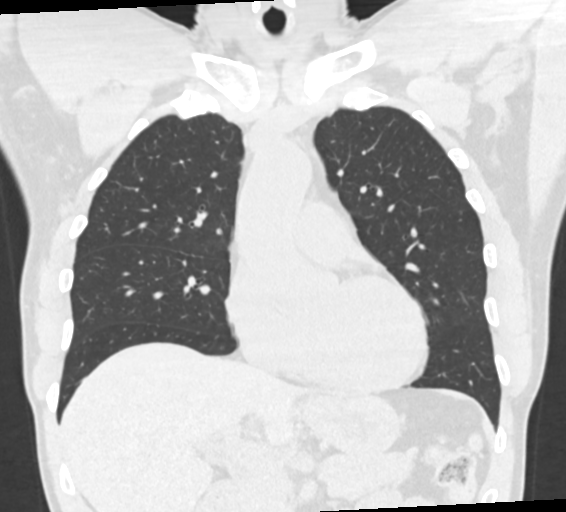

[Series 10: chest 1.00 br40 s3 super d · axial · 0.73mm/px · z∈[+1422,+1713]mm · 12 of 420 slices shown, 15 images]
[im 28/420  mediastinal]
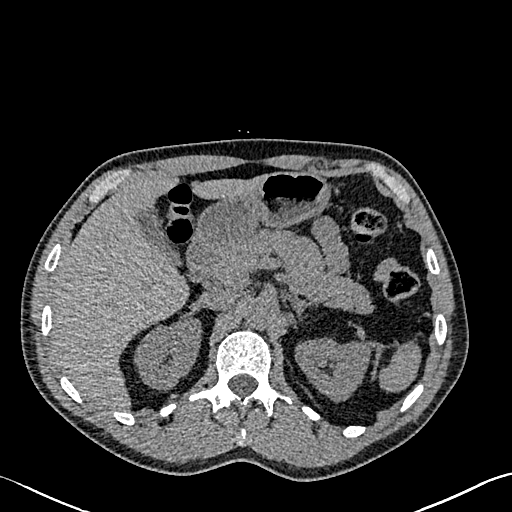
[im 28/420  lung]
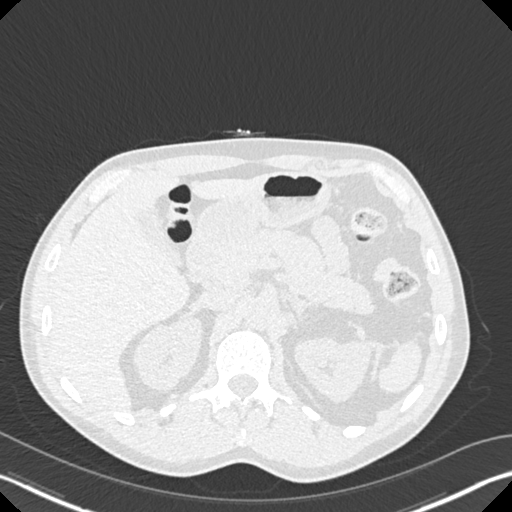
[im 56/420  lung]
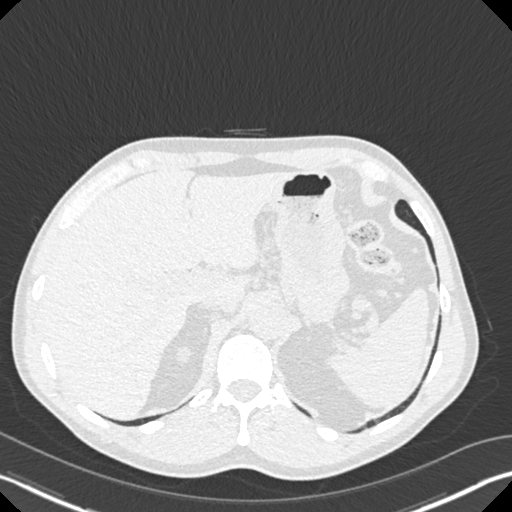
[im 84/420  lung]
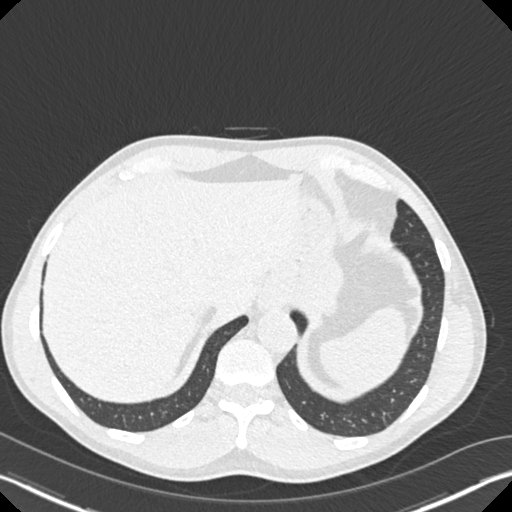
[im 140/420  lung]
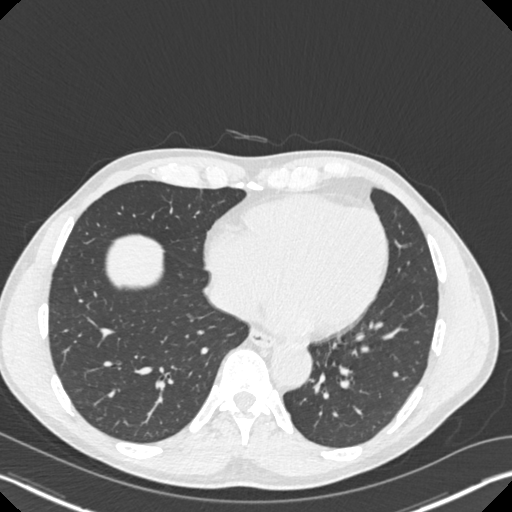
[im 168/420  mediastinal]
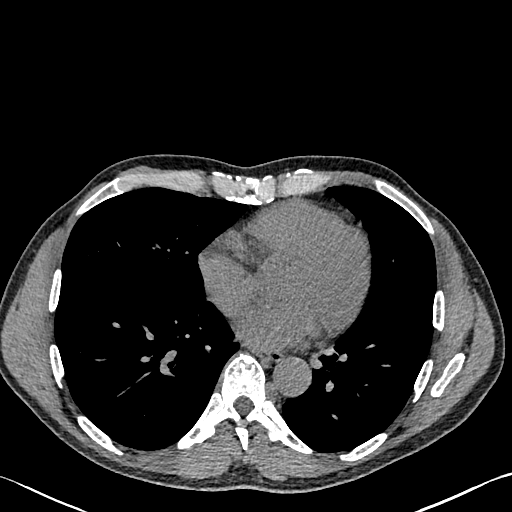
[im 168/420  lung]
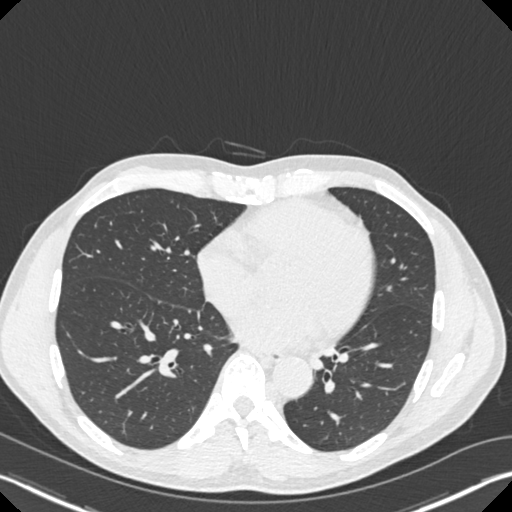
[im 196/420  lung]
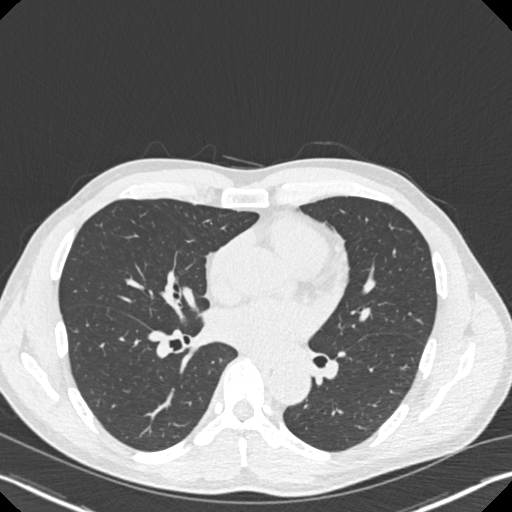
[im 224/420  lung]
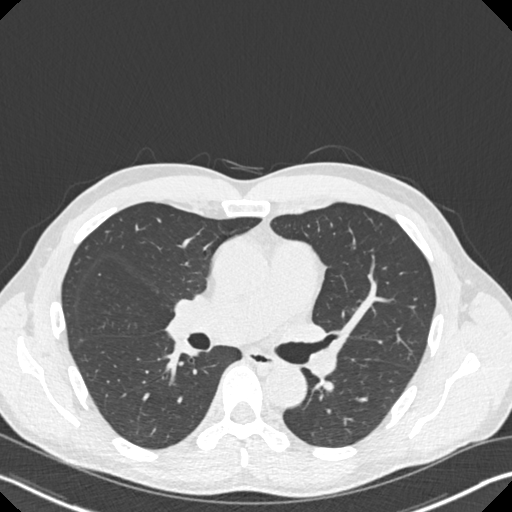
[im 252/420  lung]
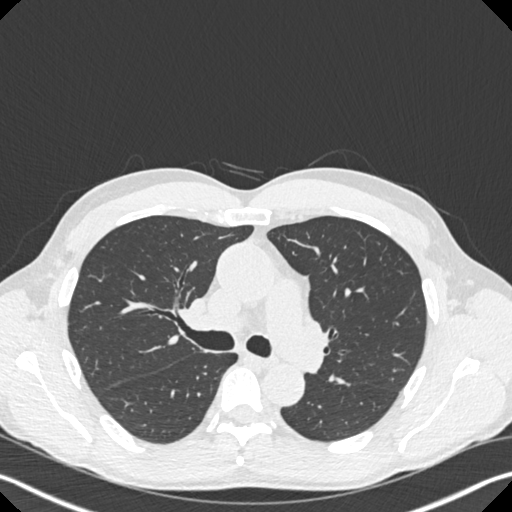
[im 280/420  mediastinal]
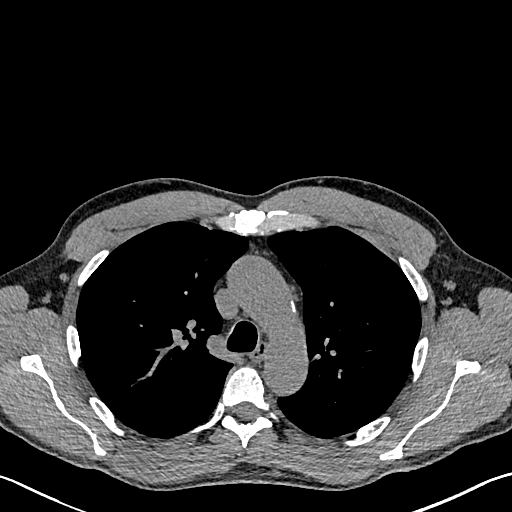
[im 280/420  lung]
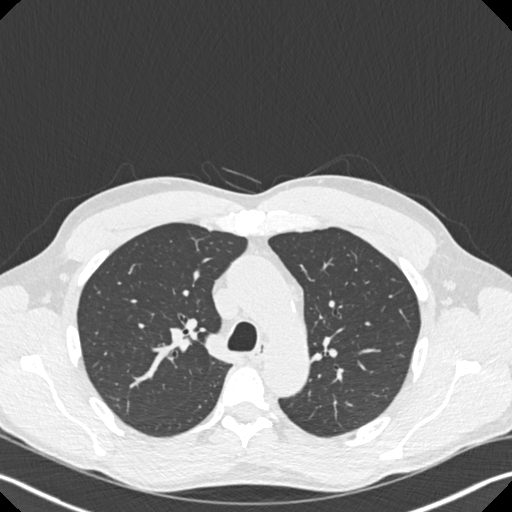
[im 336/420  lung]
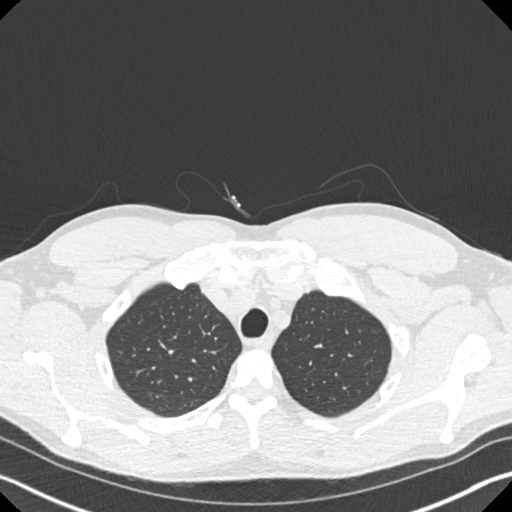
[im 364/420  lung]
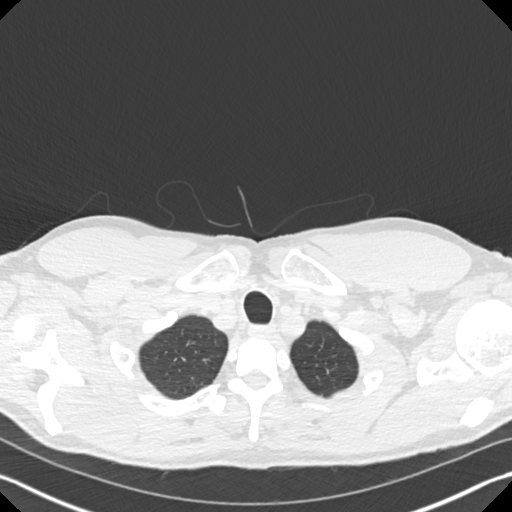
[im 392/420  lung]
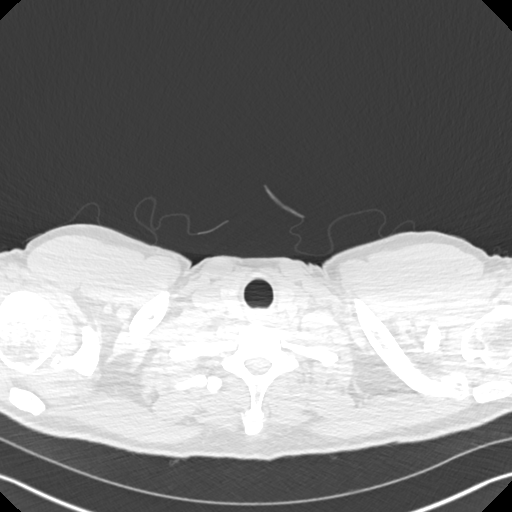

[15 of 36 positions shown; findings below may reference images not displayed]

FINDINGS: Cardiovascular: Heart size is normal. There is no significant
pericardial fluid, thickening or pericardial calcification. Aortic
atherosclerosis. No definite coronary artery calcifications.

Mediastinum/Nodes: No pathologically enlarged mediastinal or hilar
lymph nodes. Please note that accurate exclusion of hilar adenopathy
is limited on noncontrast CT scans. Esophagus is unremarkable in
appearance. No axillary lymphadenopathy.

Lungs/Pleura: 6 x 5 mm subpleural nodule in the inferior aspect of
the right upper lobe abutting the minor fissure (axial image 85 of
series 8), stable, compatible with a benign subpleural lymph node. 4
mm ground-glass attenuation nodule in the apex of the right upper
lobe (axial image 28 of series 8), stable. Other smaller pulmonary
nodules are also unchanged in number and size. No other new
suspicious appearing pulmonary nodules or masses are noted. No acute
consolidative airspace disease. No pleural effusions.

Upper Abdomen: Unremarkable.

Musculoskeletal: There are no aggressive appearing lytic or blastic
lesions noted in the visualized portions of the skeleton.
IMPRESSION: 1. Multiple small pulmonary nodules scattered throughout the lungs
bilaterally, stable in number and size, considered definitively
benign. No future imaging follow-up is recommended.
2. Aortic atherosclerosis.

Aortic Atherosclerosis ([id number]-F51.1).

## 2023-11-28 ENCOUNTER — Encounter: Payer: Self-pay | Admitting: Emergency Medicine

## 2023-11-28 ENCOUNTER — Ambulatory Visit: Payer: BC Managed Care – PPO | Admitting: Emergency Medicine

## 2023-11-28 VITALS — BP 108/60 | HR 68 | Ht 69.0 in | Wt 174.6 lb

## 2023-11-28 DIAGNOSIS — J4521 Mild intermittent asthma with (acute) exacerbation: Secondary | ICD-10-CM

## 2023-11-28 DIAGNOSIS — R918 Other nonspecific abnormal finding of lung field: Secondary | ICD-10-CM

## 2023-11-28 DIAGNOSIS — J4520 Mild intermittent asthma, uncomplicated: Secondary | ICD-10-CM

## 2023-11-28 NOTE — Assessment & Plan Note (Signed)
 He uses albuterol to pretreat exercise, probably every other day.  He had a flare in December 2023 with COVID, Back to his baseline after 2 to 3 weeks.  Plan continue same regimen.  No clear indication to start ICS or maintenance medication at this time.

## 2023-11-28 NOTE — Patient Instructions (Signed)
 We reviewed your CT scan of the chest today.  This is stable compared with priors going back to September 2021.  Good news. We will repeat your CT in September 2026.  If your nodules are stable at that time then we should not need to continue to do surveillance. We can put a reminder order in our system farther out than 1 year so please call us closer to September 2026 so we can ensure that the CT scan gets done. Continue to use your albuterol when needed for shortness of breath, chest tightness, wheezing.  Pretreat your exercise with 2 puffs. Follow Dr. Delton Coombes in September 2026 after your CT so we can review.  Call sooner if you have any problems.

## 2023-11-28 NOTE — Progress Notes (Signed)
 Subjective:    Patient ID: Mark Atkins, male    DOB: [birth year], 63 y.o.   MRN: [id number]  HPI  ROV 11/28/2023 --63 year old man, never smoker whom I have seen for adult asthma and scattered pulmonary nodular disease noted on CT scan of the chest.  We last met for video visit in 09/2021.  He had a noncalcified 6 mm right middle lobe nodule, groundglass right upper lobe nodule that we had planned to follow. Has been stable going back to 05/2020 at [facility] He is active, rides his bike. He uses albuterol as needed, about every other day, usually before exercise. He was treated w Pred in 08/2022 when he had COVID. Only takes singulair in the Fall.   Super D CT chest 11/07/2023 reviewed by me, shows an unchanged 5 mm right apical groundglass pulmonary nodule, unchanged 6 mm right peri-fissural nodule, unchanged 3 mm left lower lobe nodule.  Stable right mid lobe nodule as well.  Additional stable scattered nodules as well as a tiny calcified granuloma left upper lobe.  No new findings.   Review of Systems As per HPI  Past Medical History:  Diagnosis Date   Arthritis    left knee   Asthma    occ   Colon polyp 2009   Contact lens/glasses fitting    wears contacts or glasses   Pneumonia    child   PONV (postoperative nausea and vomiting)      Family History  Problem Relation Age of Onset   Hyperlipidemia Mother    Cancer Father        colon, skin, stomach    No hx sarcoid, HSP in the family No hx lung CA.   Social History   Socioeconomic History   Marital status: Married    Spouse name: Not on file   Number of children: Not on file   Years of education: Not on file   Highest education level: Not on file  Occupational History   Not on file  Tobacco Use   Smoking status: Never   Smokeless tobacco: Never  Substance and Sexual Activity   Alcohol use: Yes    Alcohol/week: 7.0 standard drinks of alcohol    Types: 7 Glasses of wine per week   Drug use: No   Sexual  activity: Not on file  Other Topics Concern   Not on file  Social History Narrative   Not on file   Social Drivers of Health   Financial Resource Strain: Not on file  Food Insecurity: Not on file  Transportation Needs: Not on file  Physical Activity: Not on file  Stress: Not on file  Social Connections: Unknown (04/08/2022)   Received from [facility], [facility]   Social Network    Social Network: Not on file  Intimate Partner Violence: Unknown (04/08/2022)   Received from [facility], [facility]   HITS    Physically Hurt: Not on file    Insult or Talk Down To: Not on file    Threaten Physical Harm: Not on file    Scream or Curse: Not on file     He is an ophthalmologist Has lived in [place], [place], briefly in [place].  Likes to work in the yard, some fertilizer and bleach exposure.  Some mold exposure - about 3 yrs ago.  No hx positive PPD or TB exposure.    Allergies  Allergen Reactions   No Known Allergies      Outpatient Medications Prior  to Visit  Medication Sig Dispense Refill   albuterol (VENTOLIN HFA) 108 (90 Base) MCG/ACT inhaler Inhale 1-2 puffs into the lungs every 6 (six) hours as needed for wheezing or shortness of breath.     Ascorbic Acid (VITAMIN C) 500 MG CAPS See admin instructions.     clonazePAM (KLONOPIN) 0.5 MG tablet Take 0.25 mg by mouth 2 (two) times daily as needed for anxiety.      FLOVENT HFA 220 MCG/ACT inhaler Inhale 2 puffs into the lungs 2 (two) times daily.     Glucosamine-Chondroitin (OSTEO BI-FLEX REGULAR STRENGTH) 250-200 MG TABS 1 tablet with a meal     Misc Natural Products (NF FORMULAS TESTOSTERONE PO) Take 1 tablet by mouth daily. TESTOSTERONE BOOSTER     mometasone-formoterol (DULERA) 200-5 MCG/ACT AERO Inhale 2 puffs into the lungs in the morning and at bedtime. 1 each 11   montelukast (SINGULAIR) 10 MG tablet 1 tablet     Multiple Vitamin (MULTIVITAMIN WITH MINERALS) TABS tablet Take 1 tablet by mouth daily.     Omega-3  Fatty Acids (FISH OIL) 1000 MG CAPS 1 capsule with a meal     rosuvastatin (CRESTOR) 5 MG tablet 1 tablet     Turmeric 500 MG CAPS See admin instructions.     predniSONE (DELTASONE) 10 MG tablet Take 4 tabs by mouth for 3 days, then 3 for 3 days, 2 for 3 days, 1 for 3 days and stop (Patient not taking: Reported on 11/28/2023) 30 tablet 0   No facility-administered medications prior to visit.         Objective:   Physical Exam  Vitals:   11/28/23 0919  BP: 108/60  Pulse: 68  SpO2: 96%  Weight: 174 lb 9.6 oz (79.2 kg)  Height: 5\' 9"  (1.753 m)   Gen: Pleasant, well-nourished, in no distress,  normal affect  ENT: No lesions,  mouth clear,  oropharynx clear, no postnasal drip  Neck: No JVD, no stridor  Lungs: No use of accessory muscles, no crackles or wheezing on normal respiration, no wheeze on forced expiration  Cardiovascular: RRR, heart sounds normal, no murmur or gallops, no peripheral edema  Musculoskeletal: No deformities, no cyanosis or clubbing  Neuro: alert, awake, non focal  Skin: Warm, no lesions or rash       Assessment & Plan:  Pulmonary nodules/lesions, multiple Reviewed his CT scans going back to [facility] in September 2021.  His nodules are stable.  The right upper lobe nodule is groundglass and needs to be followed for 5 years total.  Explained this to him today.  His next scan would be in September 2026 which would complete 5 years of surveillance.  Will follow-up after that to review.  Mild intermittent asthma He uses albuterol to pretreat exercise, probably every other day.  He had a flare in December 2023 with COVID, Back to his baseline after 2 to 3 weeks.  Plan continue same regimen.  No clear indication to start ICS or maintenance medication at this time.   Levy Pupa, MD, PhD 11/28/2023, 9:39 AM [facility] [phone] or if no answer before 7:00PM call [phone] For any issues after 7:00PM please call eLink  [phone]

## 2023-11-28 NOTE — Assessment & Plan Note (Signed)
 Reviewed his CT scans going back to [facility] in September 2021.  His nodules are stable.  The right upper lobe nodule is groundglass and needs to be followed for 5 years total.  Explained this to him today.  His next scan would be in September 2026 which would complete 5 years of surveillance.  Will follow-up after that to review.

## 2024-07-09 DIAGNOSIS — Z125 Encounter for screening for malignant neoplasm of prostate: Secondary | ICD-10-CM | POA: Diagnosis not present

## 2024-07-09 DIAGNOSIS — Z1211 Encounter for screening for malignant neoplasm of colon: Secondary | ICD-10-CM | POA: Diagnosis not present

## 2024-07-09 DIAGNOSIS — E782 Mixed hyperlipidemia: Secondary | ICD-10-CM | POA: Diagnosis not present

## 2024-07-09 DIAGNOSIS — Z13220 Encounter for screening for lipoid disorders: Secondary | ICD-10-CM | POA: Diagnosis not present

## 2024-07-09 DIAGNOSIS — R7303 Prediabetes: Secondary | ICD-10-CM | POA: Diagnosis not present

## 2024-07-09 DIAGNOSIS — F411 Generalized anxiety disorder: Secondary | ICD-10-CM | POA: Diagnosis not present

## 2024-07-09 DIAGNOSIS — J4520 Mild intermittent asthma, uncomplicated: Secondary | ICD-10-CM | POA: Diagnosis not present

## 2024-07-09 DIAGNOSIS — Z0000 Encounter for general adult medical examination without abnormal findings: Secondary | ICD-10-CM | POA: Diagnosis not present

## 2024-07-16 DIAGNOSIS — L821 Other seborrheic keratosis: Secondary | ICD-10-CM | POA: Diagnosis not present

## 2024-07-16 DIAGNOSIS — Z86018 Personal history of other benign neoplasm: Secondary | ICD-10-CM | POA: Diagnosis not present

## 2024-07-16 DIAGNOSIS — D485 Neoplasm of uncertain behavior of skin: Secondary | ICD-10-CM | POA: Diagnosis not present

## 2024-07-16 DIAGNOSIS — L820 Inflamed seborrheic keratosis: Secondary | ICD-10-CM | POA: Diagnosis not present

## 2024-07-16 DIAGNOSIS — D225 Melanocytic nevi of trunk: Secondary | ICD-10-CM | POA: Diagnosis not present

## 2024-07-16 DIAGNOSIS — L578 Other skin changes due to chronic exposure to nonionizing radiation: Secondary | ICD-10-CM | POA: Diagnosis not present
